# Patient Record
Sex: Male | Born: 1985 | Hispanic: No | Marital: Single | Smoking: Current every day smoker
Health system: Southern US, Community
[De-identification: ages and names within clinical notes are randomized; demographics above are authoritative.]

## PROBLEM LIST (undated history)

## (undated) DIAGNOSIS — F1910 Other psychoactive substance abuse, uncomplicated: Secondary | ICD-10-CM

---
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
## 2014-07-11 ENCOUNTER — Emergency Department (HOSPITAL_COMMUNITY)
Admission: EM | Admit: 2014-07-11 | Discharge: 2014-07-11 | Disposition: A | Discharge status: Home / Self Care | Payer: Self-pay | Attending: Emergency Medicine | Admitting: Emergency Medicine

## 2014-07-11 ENCOUNTER — Encounter (HOSPITAL_COMMUNITY): Payer: Self-pay | Admitting: *Deleted

## 2014-07-11 ENCOUNTER — Emergency Department (HOSPITAL_COMMUNITY): Payer: Self-pay

## 2014-07-11 DIAGNOSIS — S99912A Unspecified injury of left ankle, initial encounter: Secondary | ICD-10-CM | POA: Insufficient documentation

## 2014-07-11 DIAGNOSIS — Y9339 Activity, other involving climbing, rappelling and jumping off: Secondary | ICD-10-CM | POA: Insufficient documentation

## 2014-07-11 DIAGNOSIS — Y9289 Other specified places as the place of occurrence of the external cause: Secondary | ICD-10-CM | POA: Insufficient documentation

## 2014-07-11 DIAGNOSIS — L03116 Cellulitis of left lower limb: Secondary | ICD-10-CM | POA: Insufficient documentation

## 2014-07-11 DIAGNOSIS — Y9389 Activity, other specified: Secondary | ICD-10-CM | POA: Insufficient documentation

## 2014-07-11 DIAGNOSIS — Z720 Tobacco use: Secondary | ICD-10-CM | POA: Insufficient documentation

## 2014-07-11 DIAGNOSIS — X58XXXA Exposure to other specified factors, initial encounter: Secondary | ICD-10-CM | POA: Insufficient documentation

## 2014-07-11 DIAGNOSIS — Y998 Other external cause status: Secondary | ICD-10-CM | POA: Insufficient documentation

## 2014-07-11 LAB — CBC WITH DIFFERENTIAL/PLATELET
BASOS PCT: 0 % (ref 0–1)
Basophils Absolute: 0 10*3/uL (ref 0.0–0.1)
EOS ABS: 0.1 10*3/uL (ref 0.0–0.7)
EOS PCT: 1 % (ref 0–5)
HCT: 36.1 % — ABNORMAL LOW (ref 39.0–52.0)
Hemoglobin: 12.2 g/dL — ABNORMAL LOW (ref 13.0–17.0)
Lymphocytes Relative: 14 % (ref 12–46)
Lymphs Abs: 1.1 10*3/uL (ref 0.7–4.0)
MCH: 27 pg (ref 26.0–34.0)
MCHC: 33.8 g/dL (ref 30.0–36.0)
MCV: 79.9 fL (ref 78.0–100.0)
MONO ABS: 0.8 10*3/uL (ref 0.1–1.0)
MONOS PCT: 9 % (ref 3–12)
Neutro Abs: 6 10*3/uL (ref 1.7–7.7)
Neutrophils Relative %: 76 % (ref 43–77)
Platelets: 152 10*3/uL (ref 150–400)
RBC: 4.52 MIL/uL (ref 4.22–5.81)
RDW: 13.2 % (ref 11.5–15.5)
WBC: 8 10*3/uL (ref 4.0–10.5)

## 2014-07-11 LAB — BASIC METABOLIC PANEL
Anion gap: 9 (ref 5–15)
BUN: 8 mg/dL (ref 6–20)
CALCIUM: 8.8 mg/dL — AB (ref 8.9–10.3)
CO2: 26 mmol/L (ref 22–32)
CREATININE: 1.32 mg/dL — AB (ref 0.61–1.24)
Chloride: 98 mmol/L — ABNORMAL LOW (ref 101–111)
GFR calc Af Amer: >60 mL/min (ref >60)
Glucose, Bld: 103 mg/dL — ABNORMAL HIGH (ref 65–99)
Potassium: 3.8 mmol/L (ref 3.5–5.1)
Sodium: 133 mmol/L — ABNORMAL LOW (ref 135–145)

## 2014-07-11 MED ORDER — CLINDAMYCIN HCL 300 MG PO CAPS: 300.0000 mg | ORAL_CAPSULE | Freq: Four times a day (QID) | ORAL | Status: DC

## 2014-07-11 MED ORDER — CLINDAMYCIN PHOSPHATE 600 MG/50ML IV SOLN
600.0000 mg | Freq: Once | INTRAVENOUS | Status: AC
  Administered 2014-07-11: 600 mg via INTRAVENOUS
  Filled 2014-07-11: qty 50

## 2014-07-11 NOTE — ED Provider Notes (Signed)
CSN: [id number]     Arrival date & time 07/11/14  0238 History   This chart was scribed for Charles Rhineonald Maryiah Olvey, MD by Abel PrestoKara Demonbreun, ED Scribe. This patient was seen in room A02C/A02C and the patient's care was started at 3:51 AM.    Chief Complaint  Patient presents with  . Ankle Injury   Patient gave verbal permission to utilize photo for medical documentation only The image was not stored on any personal device   Patient is a 29 y.o. male presenting with lower extremity injury. The history is provided by the patient. No language interpreter was used.  Ankle Injury This is a new problem. The current episode started more than 2 days ago. The problem occurs constantly. The problem has not changed (increased redness to area) since onset.Pertinent negatives include no chest pain, no abdominal pain and no headaches.   HPI Comments: Charles Ortega is a 29 y.o. male who presents to the Emergency Department complaining of left ankle injury with onset yesterday. Pt states he jumped off a balcony 3 days ago. Noted redness to area. 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 He states redness began after jumping from balcony. Pt reports recent cough. Pt is a smoker. Pt sleeping in exam room. Pt denies back pain, neck pain, head injury, and LOC.   PMH - none  Soc hx -IVDA heroin History  Substance Use Topics  . Smoking status: Current Every Day Smoker  . Smokeless tobacco: Not on file  . Alcohol Use: No    Review of Systems  Cardiovascular: Negative for chest pain.  Gastrointestinal: Negative for abdominal pain.  Musculoskeletal: Positive for myalgias and arthralgias. Negative for back pain and neck pain.  Skin: Positive for color change (redness).  Neurological: Negative for syncope and headaches.  All other systems reviewed and are negative.     Allergies  Review of patient's allergies indicates no known allergies.  Home Medications   Prior to Admission medications   Not  on File   BP 116/63 mmHg  Pulse 98  Temp(Src) 98.6 F (37 C) (Oral)  Resp 12  SpO2 94% Physical Exam  Nursing note and vitals reviewed.  CONSTITUTIONAL: disheveled, sleeping HEAD: Normocephalic/atraumatic EYES: EOMI/PERRL ENMT: Mucous membranes moist NECK: supple no meningeal signs SPINE/BACK:entire spine nontender CV: S1/S2 noted, no murmurs/rubs/gallops noted LUNGS: scattered wheezing, no apparent distress ABDOMEN: soft, nontender, no rebound or guarding, bowel sounds noted throughout abdomen GU:no cva tenderness NEURO: Pt is somnolent but easily arousible moves all extremitiesx4.  No facial droop.   EXTREMITIES: pulses normal/equal, full ROM; no crepitus to left foot or ankle; see photo Pt can flex/extend left ankle SKIN: warm, color normal PSYCH: no abnormalities of mood noted, alert and oriented to situation      ED Course  Procedures  DIAGNOSTIC STUDIES: Oxygen Saturation is 94% on room air, normal by my interpretation.    COORDINATION OF CARE: 3:57 AM Discussed treatment plan with patient at beside, the patient agrees with the plan and has no further questions at this time.    Pt ambulatory No signs of acute fx Suspect this all due to cellulitis from IVDA He range the joint - low suspicion for septic joint No signs of deep space infection Advised repeat exam in 48hrs BP 122/66 mmHg  Pulse 89  Temp(Src) 99.6 F (37.6 C) (Oral)  Resp 16  SpO2 98%  Labs Review Labs Reviewed  CBC WITH DIFFERENTIAL/PLATELET - Abnormal; Notable for the following:  Hemoglobin 12.2 (*)    HCT 36.1 (*)    All other components within normal limits  BASIC METABOLIC PANEL - Abnormal; Notable for the following:    Sodium 133 (*)    Chloride 98 (*)    Glucose, Bld 103 (*)    Creatinine, Ser 1.32 (*)    Calcium 8.8 (*)    All other components within normal limits    Imaging Review Dg Ankle Complete Left  07/11/2014   CLINICAL DATA:  Jumped off balcony, with left foot and  ankle pain. Left foot and ankle erythema and swelling. Initial encounter.  EXAM: LEFT ANKLE COMPLETE - 3+ VIEW  COMPARISON:  None.  FINDINGS: There is no evidence of fracture or dislocation. The ankle mortise is intact; the interosseous space is within normal limits. No talar tilt or subluxation is seen.  The joint spaces are preserved. Mild medial soft tissue swelling is noted. Apparent edema is noted within Kager's fat pad.  IMPRESSION: No evidence of fracture or dislocation.   Electronically Signed   By: Roanna Raider M.D.   On: 07/11/2014 04:09   Dg Foot Complete Left  07/11/2014   CLINICAL DATA:  Jumped off balcony, with left foot and ankle pain. Erythema and swelling at the left foot and ankle. Initial encounter.  EXAM: LEFT FOOT - COMPLETE 3+ VIEW  COMPARISON:  None.  FINDINGS: There is no evidence of fracture or dislocation. The joint spaces are preserved. There is no evidence of talar subluxation; the subtalar joint is unremarkable in appearance. A small os peroneum is noted.  No significant soft tissue abnormalities are seen.  IMPRESSION: 1. No evidence of fracture or dislocation. 2. Small os peroneum noted.   Electronically Signed   By: Roanna Raider M.D.   On: 07/11/2014 04:10     EKG Interpretation None      MDM   Final diagnoses:  Cellulitis of left ankle    Nursing notes including past medical history and social history reviewed and considered in documentation xrays/imaging reviewed by myself and considered during evaluation Labs/vital reviewed myself and considered during evaluation  I personally performed the services described in this documentation, which was scribed in my presence. The recorded information has been reviewed and is accurate.        Charles Rhine, MD 07/11/14 [phone]

## 2014-07-11 NOTE — ED Notes (Signed)
The pt is c/o lt foot and ankle pain from  Jumping off a balcony after using heroin and cocaine.  He has been shooting heroin in his feet and arms  Redness and swelling lt foot and ankle

## 2014-07-11 NOTE — ED Notes (Signed)
Ambulated patient in hallway. Patient complained of moderate pain in the left ankle and limped in the hallway. We were met by Dr. Christy Gentles, who gave him the game plan for his care in the coming 10 days.

## 2014-07-11 NOTE — ED Notes (Signed)
MD at bedside. 

## 2014-10-03 ENCOUNTER — Emergency Department (HOSPITAL_COMMUNITY): Payer: MEDICAID

## 2014-10-03 ENCOUNTER — Encounter (HOSPITAL_COMMUNITY): Payer: Self-pay

## 2014-10-03 ENCOUNTER — Emergency Department (HOSPITAL_COMMUNITY): Payer: Self-pay

## 2014-10-03 ENCOUNTER — Inpatient Hospital Stay (HOSPITAL_COMMUNITY)
Admission: EM | Admit: 2014-10-03 | Discharge: 2014-10-08 | DRG: 557 | Disposition: A | Discharge status: Home / Self Care | Payer: Self-pay | Attending: Internal Medicine | Admitting: Internal Medicine

## 2014-10-03 DIAGNOSIS — M7981 Nontraumatic hematoma of soft tissue: Secondary | ICD-10-CM | POA: Diagnosis present

## 2014-10-03 DIAGNOSIS — M6282 Rhabdomyolysis: Principal | ICD-10-CM | POA: Diagnosis present

## 2014-10-03 DIAGNOSIS — E861 Hypovolemia: Secondary | ICD-10-CM | POA: Diagnosis present

## 2014-10-03 DIAGNOSIS — G9340 Encephalopathy, unspecified: Secondary | ICD-10-CM | POA: Diagnosis present

## 2014-10-03 DIAGNOSIS — R531 Weakness: Secondary | ICD-10-CM

## 2014-10-03 DIAGNOSIS — D696 Thrombocytopenia, unspecified: Secondary | ICD-10-CM | POA: Diagnosis present

## 2014-10-03 DIAGNOSIS — N19 Unspecified kidney failure: Secondary | ICD-10-CM

## 2014-10-03 DIAGNOSIS — R6510 Systemic inflammatory response syndrome (SIRS) of non-infectious origin without acute organ dysfunction: Secondary | ICD-10-CM | POA: Diagnosis present

## 2014-10-03 DIAGNOSIS — E876 Hypokalemia: Secondary | ICD-10-CM | POA: Diagnosis present

## 2014-10-03 DIAGNOSIS — G587 Mononeuritis multiplex: Secondary | ICD-10-CM | POA: Diagnosis present

## 2014-10-03 DIAGNOSIS — R7401 Elevation of levels of liver transaminase levels: Secondary | ICD-10-CM | POA: Diagnosis present

## 2014-10-03 DIAGNOSIS — E871 Hypo-osmolality and hyponatremia: Secondary | ICD-10-CM | POA: Diagnosis present

## 2014-10-03 DIAGNOSIS — G589 Mononeuropathy, unspecified: Secondary | ICD-10-CM | POA: Diagnosis present

## 2014-10-03 DIAGNOSIS — F1910 Other psychoactive substance abuse, uncomplicated: Secondary | ICD-10-CM | POA: Diagnosis present

## 2014-10-03 DIAGNOSIS — K7290 Hepatic failure, unspecified without coma: Secondary | ICD-10-CM

## 2014-10-03 DIAGNOSIS — IMO0002 Reserved for concepts with insufficient information to code with codable children: Secondary | ICD-10-CM

## 2014-10-03 DIAGNOSIS — N179 Acute kidney failure, unspecified: Secondary | ICD-10-CM | POA: Diagnosis present

## 2014-10-03 DIAGNOSIS — F17210 Nicotine dependence, cigarettes, uncomplicated: Secondary | ICD-10-CM | POA: Diagnosis present

## 2014-10-03 DIAGNOSIS — T401X1A Poisoning by heroin, accidental (unintentional), initial encounter: Secondary | ICD-10-CM | POA: Diagnosis present

## 2014-10-03 DIAGNOSIS — K7200 Acute and subacute hepatic failure without coma: Secondary | ICD-10-CM | POA: Diagnosis present

## 2014-10-03 DIAGNOSIS — D72829 Elevated white blood cell count, unspecified: Secondary | ICD-10-CM | POA: Diagnosis present

## 2014-10-03 DIAGNOSIS — R740 Nonspecific elevation of levels of transaminase and lactic acid dehydrogenase [LDH]: Secondary | ICD-10-CM

## 2014-10-03 LAB — BLOOD GAS, ARTERIAL
Acid-base deficit: 6.2 mmol/L — ABNORMAL HIGH (ref 0.0–2.0)
Bicarbonate: 18.5 mEq/L — ABNORMAL LOW (ref 20.0–24.0)
Drawn by: 11249
FIO2: 0.21
O2 SAT: 95.2 %
PATIENT TEMPERATURE: 97.7
TCO2: 16.4 mmol/L (ref 0–100)
pCO2 arterial: 35 mmHg (ref 35.0–45.0)
pH, Arterial: 7.339 — ABNORMAL LOW (ref 7.350–7.450)
pO2, Arterial: 84.8 mmHg (ref 80.0–100.0)

## 2014-10-03 LAB — CBC WITH DIFFERENTIAL/PLATELET
BASOS ABS: 0 10*3/uL (ref 0.0–0.1)
BASOS PCT: 0 % (ref 0–1)
Eosinophils Absolute: 0 10*3/uL (ref 0.0–0.7)
Eosinophils Relative: 0 % (ref 0–5)
HEMATOCRIT: 48.6 % (ref 39.0–52.0)
HEMOGLOBIN: 16.6 g/dL (ref 13.0–17.0)
LYMPHS PCT: 5 % — AB (ref 12–46)
Lymphs Abs: 1.1 10*3/uL (ref 0.7–4.0)
MCH: 28.6 pg (ref 26.0–34.0)
MCHC: 34.2 g/dL (ref 30.0–36.0)
MCV: 83.6 fL (ref 78.0–100.0)
Monocytes Absolute: 1.2 10*3/uL — ABNORMAL HIGH (ref 0.1–1.0)
Monocytes Relative: 5 % (ref 3–12)
NEUTROS ABS: 20.1 10*3/uL — AB (ref 1.7–7.7)
NEUTROS PCT: 90 % — AB (ref 43–77)
Platelets: 179 10*3/uL (ref 150–400)
RBC: 5.81 MIL/uL (ref 4.22–5.81)
RDW: 14.1 % (ref 11.5–15.5)
WBC: 22.4 10*3/uL — ABNORMAL HIGH (ref 4.0–10.5)

## 2014-10-03 LAB — COMPREHENSIVE METABOLIC PANEL
ALBUMIN: 4.5 g/dL (ref 3.5–5.0)
ALK PHOS: 107 U/L (ref 38–126)
ALT: 2312 U/L — AB (ref 17–63)
AST: 2696 U/L — AB (ref 15–41)
Anion gap: 14 (ref 5–15)
BILIRUBIN TOTAL: 0.9 mg/dL (ref 0.3–1.2)
BUN: 41 mg/dL — AB (ref 6–20)
CALCIUM: 7.9 mg/dL — AB (ref 8.9–10.3)
CO2: 20 mmol/L — ABNORMAL LOW (ref 22–32)
CREATININE: 3 mg/dL — AB (ref 0.61–1.24)
Chloride: 98 mmol/L — ABNORMAL LOW (ref 101–111)
GFR calc Af Amer: 31 mL/min — ABNORMAL LOW (ref >60)
GFR calc non Af Amer: 27 mL/min — ABNORMAL LOW (ref >60)
GLUCOSE: 110 mg/dL — AB (ref 65–99)
POTASSIUM: 5.1 mmol/L (ref 3.5–5.1)
Sodium: 132 mmol/L — ABNORMAL LOW (ref 135–145)
TOTAL PROTEIN: 7.3 g/dL (ref 6.5–8.1)

## 2014-10-03 LAB — URINALYSIS, ROUTINE W REFLEX MICROSCOPIC
Bilirubin Urine: NEGATIVE
GLUCOSE, UA: NEGATIVE mg/dL
Ketones, ur: NEGATIVE mg/dL
Nitrite: NEGATIVE
Protein, ur: 100 mg/dL — AB
SPECIFIC GRAVITY, URINE: 1.013 (ref 1.005–1.030)
UROBILINOGEN UA: 0.2 mg/dL (ref 0.0–1.0)
pH: 5.5 (ref 5.0–8.0)

## 2014-10-03 LAB — RAPID URINE DRUG SCREEN, HOSP PERFORMED
Amphetamines: NOT DETECTED
Barbiturates: NOT DETECTED
Benzodiazepines: POSITIVE — AB
COCAINE: POSITIVE — AB
Opiates: POSITIVE — AB
Tetrahydrocannabinol: NOT DETECTED

## 2014-10-03 LAB — TSH: TSH: 1.574 u[IU]/mL (ref 0.350–4.500)

## 2014-10-03 LAB — URINE MICROSCOPIC-ADD ON

## 2014-10-03 LAB — I-STAT CG4 LACTIC ACID, ED
Lactic Acid, Venous: 3.53 mmol/L (ref 0.5–2.0)
Lactic Acid, Venous: 1.33 mmol/L (ref 0.5–2.0)

## 2014-10-03 LAB — CK

## 2014-10-03 MED ORDER — LORAZEPAM 2 MG/ML IJ SOLN
1.0000 mg | Freq: Once | INTRAMUSCULAR | Status: AC
  Administered 2014-10-03: 1 mg via INTRAVENOUS
  Filled 2014-10-03: qty 1

## 2014-10-03 MED ORDER — GADOBENATE DIMEGLUMINE 529 MG/ML IV SOLN
15.0000 mL | Freq: Once | INTRAVENOUS | Status: AC | PRN
  Administered 2014-10-03: 15 mL via INTRAVENOUS

## 2014-10-03 MED ORDER — HYDROMORPHONE HCL 1 MG/ML IJ SOLN: 1.0000 mg | Freq: Once | INTRAMUSCULAR | Status: DC

## 2014-10-03 MED ORDER — SODIUM CHLORIDE 0.9 % IV SOLN
Freq: Once | INTRAVENOUS | Status: AC
  Administered 2014-10-03: 19:00:00 via INTRAVENOUS

## 2014-10-03 MED ORDER — SODIUM BICARBONATE 8.4 % IV SOLN
50.0000 meq | Freq: Once | INTRAVENOUS | Status: AC
  Administered 2014-10-04: 50 meq via INTRAVENOUS
  Filled 2014-10-03: qty 50
  Filled 2014-10-03: qty 100

## 2014-10-03 MED ORDER — LORAZEPAM 2 MG/ML IJ SOLN
0.5000 mg | Freq: Four times a day (QID) | INTRAMUSCULAR | Status: DC | PRN
  Administered 2014-10-04: 1 mg via INTRAVENOUS
  Filled 2014-10-03: qty 1

## 2014-10-03 MED ORDER — SODIUM BICARBONATE 8.4 % IV SOLN
INTRAVENOUS | Status: DC
  Administered 2014-10-04 – 2014-10-05 (×4): via INTRAVENOUS
  Filled 2014-10-03 (×9): qty 100

## 2014-10-03 MED ORDER — METHOCARBAMOL 500 MG PO TABS
500.0000 mg | ORAL_TABLET | Freq: Once | ORAL | Status: AC
  Administered 2014-10-03: 500 mg via ORAL
  Filled 2014-10-03: qty 1

## 2014-10-03 MED ORDER — SODIUM CHLORIDE 0.9 % IV BOLUS (SEPSIS)
1000.0000 mL | Freq: Once | INTRAVENOUS | Status: AC
  Administered 2014-10-03: 1000 mL via INTRAVENOUS

## 2014-10-03 MED ORDER — HYDROMORPHONE HCL 1 MG/ML IJ SOLN
0.5000 mg | Freq: Once | INTRAMUSCULAR | Status: AC
  Administered 2014-10-03: 0.5 mg via INTRAVENOUS
  Filled 2014-10-03: qty 1

## 2014-10-03 NOTE — ED Notes (Signed)
Pt in MRI will obtain labs when pt returns.

## 2014-10-03 NOTE — Consult Note (Addendum)
Consult Reason for Consult:bilateral LE weakness Referring Physician: Dr Gypsy Balsam ED  CC: lower extremity weakness  HPI: Charles Ortega is an 29 y.o. male hx of polysubstance abuse, including daily marijuana and frequent cocaine and heroin use. Presents today unable to walk after waking up in the park. He is unsure how long he was in the park. Reports using heroin sometime yesterday and then does not recall anything until he awoke today. States he had to use his arms to pull himself under a covered area due to the rain. Notes some pain in his bilateral quadriceps and hips. Denies any UE weakness. No difficulty or changes with bowel/bladder.   Labs pertinent for CK > 50,000, lactic acid 3.53, BUN 41, Cr 3.00, ASA 2696, ALT 2312, WBC 22. UDS positive for opiates, cocaine and benzodiazepines.   No past medical history on file.  No past surgical history on file.  No family history on file.  Social History:  reports that he has been smoking.  He does not have any smokeless tobacco history on file. He reports that he drinks about 0.6 oz of alcohol per week. He reports that he uses illicit drugs (Cocaine).  No Known Allergies  Medications: I have reviewed the patient's current medications.   ROS: Out of a complete 14 system review, the patient complains of only the following symptoms, and all other reviewed systems are negative. +weakness  Physical Examination: Filed Vitals:   10/03/14 1845  BP: 111/77  Pulse: 118  Temp:   Resp: 18   Physical Exam  Constitutional: He appears well-developed and well-nourished.  Psych: Affect appropriate to situation Eyes: No scleral injection HENT: No OP obstrucion Head: Normocephalic.  Cardiovascular: Normal rate and regular rhythm.  Respiratory: Effort normal and breath sounds normal.  GI: Soft. Bowel sounds are normal. No distension. There is no tenderness.  Skin: multiple track marks throughout  Neurologic Examination Mental  Status: Alert, oriented, thought content appropriate.  Speech fluent without evidence of aphasia.  Able to follow 3 step commands without difficulty. Cranial Nerves: II: funduscopic exam wnl bilaterally, visual fields grossly normal, pupils equal, round, reactive to light and accommodation III,IV, VI: ptosis not present, extra-ocular motions intact bilaterally V,VII: smile symmetric, facial light touch sensation normal bilaterally VIII: hearing normal bilaterally IX,X: gag reflex present XI: trapezius strength/neck flexion strength normal bilaterally XII: tongue strength normal  Motor: Right : Upper extremity    Left:     Upper extremity 5/5 deltoid       5/5 deltoid 5/5 biceps      5/5 biceps  5/5 triceps      5/5 triceps 5/5 hand grip      5/5 hand grip  Lower extremity     Lower extremity 5/5 hip flexor      5/5 hip flexor 5/5 hip adductors     5/5 hip adductors 5/5 hip abductors     5/5 hip abductors 2/5 quadricep      2/5 quadriceps  5/5 hamstrings     5/5 hamstrings 5/5 plantar flexion       5/5 plantar flexion 5/5 plantar extension     5/5 plantar extension Tone and bulk:normal tone throughout; no atrophy noted Sensory: Pinprick and light touch decreased bilateral LE in L4/5 distribution Deep Tendon Reflexes: 2+ biceps, triceps brachioradialis bilaterally. Trace patellar bilaterally. 2+ achilles bilaterally Plantars: Right: downgoing   Left: downgoing Cerebellar: normal finger-to-nose, unable to test HTS Gait: deferred  Laboratory Studies:   Basic Metabolic Panel:  No results for input(s): NA, K, CL, CO2, GLUCOSE, BUN, CREATININE, CALCIUM, MG, PHOS in the last 168 hours.  Liver Function Tests: No results for input(s): AST, ALT, ALKPHOS, BILITOT, PROT, ALBUMIN in the last 168 hours. No results for input(s): LIPASE, AMYLASE in the last 168 hours. No results for input(s): AMMONIA in the last 168 hours.  CBC:  Recent Labs Lab 10/03/14 1843  WBC 22.4*  NEUTROABS 20.1*   HGB 16.6  HCT 48.6  MCV 83.6  PLT 179    Cardiac Enzymes: No results for input(s): CKTOTAL, CKMB, CKMBINDEX, TROPONINI in the last 168 hours.  BNP: Invalid input(s): POCBNP  CBG: No results for input(s): GLUCAP in the last 168 hours.  Microbiology: No results found for this or any previous visit.  Coagulation Studies: No results for input(s): LABPROT, INR in the last 72 hours.  Urinalysis:  Recent Labs Lab 10/03/14 1820  COLORURINE RED*  LABSPEC 1.013  PHURINE 5.5  GLUCOSEU NEGATIVE  HGBUR LARGE*  BILIRUBINUR NEGATIVE  KETONESUR NEGATIVE  PROTEINUR 100*  UROBILINOGEN 0.2  NITRITE NEGATIVE  LEUKOCYTESUR TRACE*    Lipid Panel:  No results found for: CHOL, TRIG, HDL, CHOLHDL, VLDL, LDLCALC  HgbA1C: No results found for: HGBA1C  Urine Drug Screen:     Component Value Date/Time   LABOPIA POSITIVE* 10/03/2014 1820   COCAINSCRNUR POSITIVE* 10/03/2014 1820   LABBENZ POSITIVE* 10/03/2014 1820   AMPHETMU NONE DETECTED 10/03/2014 1820   THCU NONE DETECTED 10/03/2014 1820   LABBARB NONE DETECTED 10/03/2014 1820    Alcohol Level: No results for input(s): ETH in the last 168 hours.  Other results:  Imaging: No results found.   Assessment/Plan: 28y/o male with history of multiple drug abuse presenting with acute onset of proximal lower extremity weakness localized to his bilateral quadriceps. Exam also notable for decreased LT and PP below the knees bilaterally. Labs pertinent for markedly elevated CK > 50,000, elevated AST and ALT, elevated lactic acid, BUN and Cr. UDS + for opiates, cocaine and benzo. MRI imaging reviewed, shows no acute process.    Both cocaine and heroin have been known to cause rhabdomyolysis. Prolonged stasis on a hard surface could also be contributing. No noted history of prolonged exertion. Will check for alternative etiologies.   -HIV, Hepatitis panel, ANA, ANCA, TSH -IV hydration and close monitoring of renal function -rehab  evaluation -will continue to follow  Elspeth Cho, DO [facility] [phone]  If 7pm- 7am, please page neurology on call as listed in AMION. 10/03/2014, 7:32 PM

## 2014-10-03 NOTE — ED Notes (Signed)
Patient transported to MRI 

## 2014-10-03 NOTE — ED Notes (Signed)
As per reported by Morrie Sheldon RN  Pt is in MRI, his cap refill is sluggish in lower extremities and bilateral hip and leg pain

## 2014-10-03 NOTE — ED Notes (Signed)
Dr Hyacinth Meeker made aware of abnormal Lactic.

## 2014-10-03 NOTE — ED Notes (Signed)
Pt returned from MRI , doctor at bedside doing neuro assessment,,  Pt states he was found in park and last he remembers he had been there since 2 pm yesterday.  Pt is alert and oriented,  Continues to be unable to lift and hold lower legs as reported at shift change

## 2014-10-03 NOTE — ED Notes (Signed)
Pt is still in MRI.

## 2014-10-03 NOTE — H&P (Signed)
[facility] Admission History and Physical       Charles Ortega ZOX:[id number] DOB: 1985-09-07 DOA: 10/03/2014  Referring physician: EDP PCP: No primary care provider on file.  Specialists:   Chief Complaint: Weakness  HPI: Charles Ortega is a 29 y.o. male who presented to the ED after [place] the [place] after [place] up on Heroin and using cocaine 24 hours earlier.  He reports havign increased pain all over and weakness  In both legs.    He was evaluated in the ED and found to have a CPK of > 50, 000.    He was also found to have elevated LFTs and an increased BUN/Cr.   He was unable to move his legs so an MRI of the T-Spine and Lspine were performed, and  Neurology was consulted and he was evaluated by Dr. Elspeth Cho.   His symptoms began to improve after hydration was initiated.      Review of Systems:  Constitutional: No Weight Loss, No Weight Gain, Night Sweats, Fevers, Chills, Dizziness, Light Headedness, Fatigue, or Generalized Weakness HEENT: No Headaches, Difficulty Swallowing,Tooth/Dental Problems,Sore Throat,  No Sneezing, Rhinitis, Ear Ache, Nasal Congestion, or Post Nasal Drip,  Cardio-vascular:  No Chest pain, Orthopnea, PND, Edema in Lower Extremities, Anasarca, Dizziness, Palpitations  Resp: No Dyspnea, No DOE, No Productive Cough, No Non-Productive Cough, No Hemoptysis, No Wheezing.    GI: No Heartburn, Indigestion, Abdominal Pain, Nausea, Vomiting, Diarrhea, Constipation, Hematemesis, Hematochezia, Melena, Change in Bowel Habits,  Loss of Appetite  GU: No Dysuria, No Change in Color of Urine, No Urgency or Urinary Frequency, No Flank pain.  Musculoskeletal:  +Generalized Myalgias, +Bilateral Hip and thigh Pain with Decreased Range of Motion, No Back Pain.  Neurologic: No Syncope, No Seizures, Muscle Weakness, Paresthesia, Vision Disturbance or Loss, No Diplopia, No Vertigo, No Difficulty Walking,  Skin: No Rash or Lesions. Psych: No Change in Mood  or Affect, No Depression or Anxiety, No Memory loss, No Confusion, or Hallucinations   No past medical history on file.   No past surgical history on file.    Prior to Admission medications   Medication Sig Start Date End Date Taking? Authorizing Provider  clindamycin (CLEOCIN) 300 MG capsule Take 1 capsule (300 mg total) by mouth 4 (four) times daily. X 7 days Patient not taking: Reported on 10/03/2014 07/11/14   Zadie Rhine, MD     No Known Allergies    Social History:  reports that he has been smoking.  He does not have any smokeless tobacco history on file. He reports that he drinks about 0.6 oz of alcohol per week. He reports that he uses illicit drugs (Cocaine).     No family history on file.     Physical Exam:  GEN:  Pleasant Thin Disheveled 29 y.o. Caucasian male examined and in no acute distress; cooperative with exam Filed Vitals:   10/03/14 1726 10/03/14 1729 10/03/14 1845  BP:  91/70 111/77  Pulse:  98 118  Temp:  97.7 F (36.5 C)   TempSrc:  Oral   Resp:  17 18  Height: 5\' 11"  (1.803 m)    Weight: 77.111 kg (170 lb)    SpO2:  98% 93%   Blood pressure 111/77, pulse 118, temperature 97.7 F (36.5 C), temperature source Oral, resp. rate 18, height 5\' 11"  (1.803 m), weight 77.111 kg (170 lb), SpO2 93 %. PSYCH: He is alert and oriented x4; does not appear anxious does not appear depressed; affect  is normal HEENT: Normocephalic and Atraumatic, Mucous membranes pink; PERRLA; EOM intact; Fundi:  Benign;  No scleral icterus, Nares: Patent, Oropharynx: Clear, Fair Dentition,    Neck:  FROM, No Cervical Lymphadenopathy nor Thyromegaly or Carotid Bruit; No JVD; Breasts:: Not examined CHEST WALL: No tenderness CHEST: Normal respiration, clear to auscultation bilaterally HEART: Regular rate and rhythm; no murmurs rubs or gallops BACK: No kyphosis or scoliosis; No CVA tenderness ABDOMEN: Positive Bowel Sounds, Soft Non-Tender, No Rebound or Guarding; No Masses, No  Organomegaly. Rectal Exam: Not done EXTREMITIES: No Cyanosis, Clubbing, or Edema; Numerous Ulcerations of arms and legs ( Track Marks) Genitalia: not examined PULSES: 2+ and symmetric SKIN: Normal hydration no rash or ulceration CNS:  Alert and Oriented x 4, No Focal Deficits, Generalized Weakness and Proximal Weakness in Both  Quadriceps Vascular: pulses palpable throughout    Labs on Admission:  Basic Metabolic Panel:  Recent Labs Lab 10/03/14 1843  NA 132*  K 5.1  CL 98*  CO2 20*  GLUCOSE 110*  BUN 41*  CREATININE 3.00*  CALCIUM 7.9*   Liver Function Tests:  Recent Labs Lab 10/03/14 1843  AST 2696*  ALT 2312*  ALKPHOS 107  BILITOT 0.9  PROT 7.3  ALBUMIN 4.5   No results for input(s): LIPASE, AMYLASE in the last 168 hours. No results for input(s): AMMONIA in the last 168 hours. CBC:  Recent Labs Lab 10/03/14 1843  WBC 22.4*  NEUTROABS 20.1*  HGB 16.6  HCT 48.6  MCV 83.6  PLT 179   Cardiac Enzymes:  Recent Labs Lab 10/03/14 1843  CKTOTAL >50000*    BNP (last 3 results) No results for input(s): BNP in the last 8760 hours.  ProBNP (last 3 results) No results for input(s): PROBNP in the last 8760 hours.  CBG: No results for input(s): GLUCAP in the last 168 hours.  Radiological Exams on Admission: Dg Chest 2 View  10/03/2014   CLINICAL DATA:  Abnormal x-ray. Airspace opacities suggested on MRI thoracic spine. Patient reports no chest pain or complaints. Current smoker.  EXAM: CHEST  2 VIEW  COMPARISON:  06/24/2006  FINDINGS: The heart size and mediastinal contours are within normal limits. Both lungs are clear. The visualized skeletal structures are unremarkable.  IMPRESSION: No active cardiopulmonary disease. MRI findings may have been related to motion artifact.   Electronically Signed   By: Burman Nieves M.D.   On: 10/03/2014 22:44   Mr Thoracic Spine W Wo Contrast  10/03/2014   CLINICAL DATA:  Lower extremity numbness, hip and back pain.  Difficulty walking, weakness. Found in park, one-day of amnesia.  EXAM: MRI THORACIC AND LUMBAR SPINE WITHOUT AND WITH CONTRAST  TECHNIQUE: Multiplanar and multiecho pulse sequences of the thoracic and lumbar spine were obtained without and with intravenous contrast.  CONTRAST:  15mL MULTIHANCE GADOBENATE DIMEGLUMINE 529 MG/ML IV SOLN  COMPARISON:  None.  FINDINGS: MR THORACIC SPINE FINDINGS  Moderately motion degraded examination.  The thoracic alignment is normal. There are no marrow space abnormalities and there is no fracture or mass lesion. No abnormal osseous or intradiscal enhancement. Patchy airspace opacities RIGHT lung.  The cord has normal signal and morphology. No abnormal cord, leptomeningeal nor epidural enhancement. The disks appear normal and there is no disc protrusion, spondylosis, or spinal stenosis.  MR LUMBAR SPINE FINDINGS  Moderately motion degraded examination. Lumbar vertebral bodies and posterior elements are intact and aligned with maintenance of the lumbar lordosis. Intervertebral disc morphology is maintained. Decreased T2 signal within the  L4-5 disc compatible with mild desiccation. Minimal chronic discogenic endplate changes L4-5. No STIR signal abnormality to suggest acute osseous process. No abnormal osseous or intradiscal enhancement.  Conus medullaris terminates at T12-L1 and appears normal in morphology and signal characteristics. No abnormal cord, leptomeningeal or epidural enhancement. Limited by patient positioning and motion though, the there appears to be T2 bright signal within the RIGHT iliopsoas muscles at the level of the pelvis.  Level by level evaluation:  L1-2, L2-3, L3-4: No significant disc bulge, canal stenosis or neural foraminal narrowing.  L4-5: Small central disc protrusion, enhancing annular fissure. Minimal facet arthropathy and ligamentum flavum redundancy without canal stenosis or definite neural foraminal narrowing.  L5-S1: Tiny broad-based LEFT central  disc protrusion without canal stenosis or neural foraminal narrowing.  IMPRESSION: MRI THORACIC SPINE: Motion degraded examination with generally normal MRI of the thoracic spine with and without contrast.  Included view of the chest demonstrates patchy airspace opacities RIGHT lung for which dedicated chest radiograph is recommended.  MRI LUMBAR SPINE: Small L4-5 and L5-S1 disc protrusions, L4-5 annular fissure. No neurocompressive changes.  No acute fracture, malalignment or suspicious enhancement.  Suspected edema/strain of the RIGHT iliopsoas muscles though, this could alternatively represent motion artifact.   Electronically Signed   By: Awilda Metro M.D.   On: 10/03/2014 21:56   Mr Lumbar Spine W Wo Contrast  10/03/2014   CLINICAL DATA:  Lower extremity numbness, hip and back pain. Difficulty walking, weakness. Found in park, one-day of amnesia.  EXAM: MRI THORACIC AND LUMBAR SPINE WITHOUT AND WITH CONTRAST  TECHNIQUE: Multiplanar and multiecho pulse sequences of the thoracic and lumbar spine were obtained without and with intravenous contrast.  CONTRAST:  15mL MULTIHANCE GADOBENATE DIMEGLUMINE 529 MG/ML IV SOLN  COMPARISON:  None.  FINDINGS: MR THORACIC SPINE FINDINGS  Moderately motion degraded examination.  The thoracic alignment is normal. There are no marrow space abnormalities and there is no fracture or mass lesion. No abnormal osseous or intradiscal enhancement. Patchy airspace opacities RIGHT lung.  The cord has normal signal and morphology. No abnormal cord, leptomeningeal nor epidural enhancement. The disks appear normal and there is no disc protrusion, spondylosis, or spinal stenosis.  MR LUMBAR SPINE FINDINGS  Moderately motion degraded examination. Lumbar vertebral bodies and posterior elements are intact and aligned with maintenance of the lumbar lordosis. Intervertebral disc morphology is maintained. Decreased T2 signal within the L4-5 disc compatible with mild desiccation. Minimal  chronic discogenic endplate changes L4-5. No STIR signal abnormality to suggest acute osseous process. No abnormal osseous or intradiscal enhancement.  Conus medullaris terminates at T12-L1 and appears normal in morphology and signal characteristics. No abnormal cord, leptomeningeal or epidural enhancement. Limited by patient positioning and motion though, the there appears to be T2 bright signal within the RIGHT iliopsoas muscles at the level of the pelvis.  Level by level evaluation:  L1-2, L2-3, L3-4: No significant disc bulge, canal stenosis or neural foraminal narrowing.  L4-5: Small central disc protrusion, enhancing annular fissure. Minimal facet arthropathy and ligamentum flavum redundancy without canal stenosis or definite neural foraminal narrowing.  L5-S1: Tiny broad-based LEFT central disc protrusion without canal stenosis or neural foraminal narrowing.  IMPRESSION: MRI THORACIC SPINE: Motion degraded examination with generally normal MRI of the thoracic spine with and without contrast.  Included view of the chest demonstrates patchy airspace opacities RIGHT lung for which dedicated chest radiograph is recommended.  MRI LUMBAR SPINE: Small L4-5 and L5-S1 disc protrusions, L4-5 annular fissure. No neurocompressive changes.  No acute fracture, malalignment or suspicious enhancement.  Suspected edema/strain of the RIGHT iliopsoas muscles though, this could alternatively represent motion artifact.   Electronically Signed   By: Awilda Metro M.D.   On: 10/03/2014 21:56     Assessment/Plan:      29 y.o. male with  Principal Problem:   1.    Rhabdomyolysis   IVFs   Monitor CPKs   Active Problems:   2.    AKI (acute kidney injury)   Monitor BUN/Cr   IVFs     3.    Elevated transaminase level   Checking Hepatitis Panel, HIV, and ANCA, ANA Levels     Monitor Trend     4.    Polysubstance abuse   Social Work Librarian, academic for Substance Abuse Help     5.    Leukocytosis   Monitor Trend      6.    DVT Prophylaxis   SCDs     Code Status:     FULL CODE   Family Communication:    No Family Present    Disposition Plan:    Inpatient Status        Time spent:  83 Minutes      Ron Parker [facility] Pager [phone]   If 7AM -7PM Please Contact the Day Rounding Team MD for [facility]  If 7PM-7AM, Please Contact Night-Floor Coverage  [email or web address] Password [facility] 10/03/2014, 10:47 PM     ADDENDUM:   Patient was seen and examined on 10/03/2014

## 2014-10-03 NOTE — ED Notes (Signed)
Pt is in MRI, so they will be a delay in labs and saline

## 2014-10-03 NOTE — ED Notes (Signed)
Per EMS- c/o numbness in legs bilaterally. Denies pain. Full sensation in both legs. VS: BP 140/76 HR 78 RR 18.

## 2014-10-03 NOTE — ED Notes (Signed)
MRI called [id number]  Pt in severe pain cannot hold still for MRI imaging, this writer spoke with Dr Hyacinth Meeker, he gave verbal order for 0.5 mg Dilaudid IV

## 2014-10-03 NOTE — ED Provider Notes (Signed)
CSN: [id number]     Arrival date & time 10/03/14  1715 History   First MD Initiated Contact with Patient 10/03/14 1725     Chief Complaint  Patient presents with  . Numbness    legs     (Consider location/radiation/quality/duration/timing/severity/associated sxs/prior Treatment) HPI  The patient is a 29 year old male, he has a history of multiple drug abuse including daily marijuana, frequent cocaine and heroin. The patient states that he woke up in the park, he is not sure how long he has been there but he was unable to walk when he awoke and states he had to pull himself by his arms under a covered area b/c of the rain - he thinks that the last time that he couuld walk was yesterday, when he used Heroin and fell asleep.  He denies all other sx including fevers, chills, back pain, neck pain, weakness or numbness of the arms, there is no abdominal pain chest pain or shortness of breath.    No past medical history on file. No past surgical history on file. No family history on file. Social History  Substance Use Topics  . Smoking status: Current Every Day Smoker  . Smokeless tobacco: Not on file  . Alcohol Use: 0.6 oz/week    1 Cans of beer per week    Review of Systems  All other systems reviewed and are negative.     Allergies  Review of patient's allergies indicates no known allergies.  Home Medications   Prior to Admission medications   Medication Sig Start Date End Date Taking? Authorizing Provider  clindamycin (CLEOCIN) 300 MG capsule Take 1 capsule (300 mg total) by mouth 4 (four) times daily. X 7 days Patient not taking: Reported on 10/03/2014 07/11/14   Zadie Rhine, MD   BP 111/77 mmHg  Pulse 118  Temp(Src) 97.7 F (36.5 C) (Oral)  Resp 18  Ht 5\' 11"  (1.803 m)  Wt 170 lb (77.111 kg)  BMI 23.72 kg/m2  SpO2 93% Physical Exam  Constitutional: He appears well-developed and well-nourished. No distress.  HENT:  Head: Normocephalic and atraumatic.   Mouth/Throat: Oropharynx is clear and moist. No oropharyngeal exudate.  Eyes: Conjunctivae and EOM are normal. Pupils are equal, round, and reactive to light. Right eye exhibits no discharge. Left eye exhibits no discharge. No scleral icterus.  Neck: Normal range of motion. Neck supple. No JVD present. No thyromegaly present.  Cardiovascular: Normal rate, regular rhythm, normal heart sounds and intact distal pulses.  Exam reveals no gallop and no friction rub.   No murmur heard. Pulmonary/Chest: Effort normal and breath sounds normal. No respiratory distress. He has no wheezes. He has no rales.  Abdominal: Soft. Bowel sounds are normal. He exhibits no distension and no mass. There is no tenderness.  Musculoskeletal: Normal range of motion. He exhibits no edema or tenderness.  Lymphadenopathy:    He has no cervical adenopathy.  Neurological: He is alert.  This first the patient has weakness of his quadriceps but he is able to have normal strength at his hamstrings, normal hip flexors, decreased sensation bilaterally below the knees, decreased but present reflexes at the knees.  Skin: Skin is warm and dry. No rash noted. No erythema.  Psychiatric: He has a normal mood and affect. His behavior is normal.  Nursing note and vitals reviewed.   ED Course  Procedures (including critical care time) Labs Review Labs Reviewed  CBC WITH DIFFERENTIAL/PLATELET - Abnormal; Notable for the following:    WBC  22.4 (*)    Neutrophils Relative % 90 (*)    Neutro Abs 20.1 (*)    Lymphocytes Relative 5 (*)    Monocytes Absolute 1.2 (*)    All other components within normal limits  COMPREHENSIVE METABOLIC PANEL - Abnormal; Notable for the following:    Sodium 132 (*)    Chloride 98 (*)    CO2 20 (*)    Glucose, Bld 110 (*)    BUN 41 (*)    Creatinine, Ser 3.00 (*)    Calcium 7.9 (*)    AST 2696 (*)    ALT 2312 (*)    GFR calc non Af Amer 27 (*)    GFR calc Af Amer 31 (*)    All other components  within normal limits  URINE RAPID DRUG SCREEN, HOSP PERFORMED - Abnormal; Notable for the following:    Opiates POSITIVE (*)    Cocaine POSITIVE (*)    Benzodiazepines POSITIVE (*)    All other components within normal limits  URINALYSIS, ROUTINE W REFLEX MICROSCOPIC (NOT AT [facility]) - Abnormal; Notable for the following:    Color, Urine RED (*)    APPearance CLOUDY (*)    Hgb urine dipstick LARGE (*)    Protein, ur 100 (*)    Leukocytes, UA TRACE (*)    All other components within normal limits  CK - Abnormal; Notable for the following:    Total CK >50000 (*)    All other components within normal limits  I-STAT CG4 LACTIC ACID, ED - Abnormal; Notable for the following:    Lactic Acid, Venous 3.53 (*)    All other components within normal limits  URINE MICROSCOPIC-ADD ON  HIV ANTIBODY (ROUTINE TESTING)  HEPATITIS PANEL, ACUTE  ANTINUCLEAR ANTIBODIES, IFA  ANCA TITERS  TSH  BLOOD GAS, ARTERIAL  HEPATIC FUNCTION PANEL  I-STAT CG4 LACTIC ACID, ED    Imaging Review Dg Chest 2 View  10/03/2014   CLINICAL DATA:  Abnormal x-ray. Airspace opacities suggested on MRI thoracic spine. Patient reports no chest pain or complaints. Current smoker.  EXAM: CHEST  2 VIEW  COMPARISON:  06/24/2006  FINDINGS: The heart size and mediastinal contours are within normal limits. Both lungs are clear. The visualized skeletal structures are unremarkable.  IMPRESSION: No active cardiopulmonary disease. MRI findings may have been related to motion artifact.   Electronically Signed   By: Burman Nieves M.D.   On: 10/03/2014 22:44   Mr Thoracic Spine W Wo Contrast  10/03/2014   CLINICAL DATA:  Lower extremity numbness, hip and back pain. Difficulty walking, weakness. Found in park, one-day of amnesia.  EXAM: MRI THORACIC AND LUMBAR SPINE WITHOUT AND WITH CONTRAST  TECHNIQUE: Multiplanar and multiecho pulse sequences of the thoracic and lumbar spine were obtained without and with intravenous contrast.  CONTRAST:   15mL MULTIHANCE GADOBENATE DIMEGLUMINE 529 MG/ML IV SOLN  COMPARISON:  None.  FINDINGS: MR THORACIC SPINE FINDINGS  Moderately motion degraded examination.  The thoracic alignment is normal. There are no marrow space abnormalities and there is no fracture or mass lesion. No abnormal osseous or intradiscal enhancement. Patchy airspace opacities RIGHT lung.  The cord has normal signal and morphology. No abnormal cord, leptomeningeal nor epidural enhancement. The disks appear normal and there is no disc protrusion, spondylosis, or spinal stenosis.  MR LUMBAR SPINE FINDINGS  Moderately motion degraded examination. Lumbar vertebral bodies and posterior elements are intact and aligned with maintenance of the lumbar lordosis. Intervertebral disc morphology is maintained.  Decreased T2 signal within the L4-5 disc compatible with mild desiccation. Minimal chronic discogenic endplate changes L4-5. No STIR signal abnormality to suggest acute osseous process. No abnormal osseous or intradiscal enhancement.  Conus medullaris terminates at T12-L1 and appears normal in morphology and signal characteristics. No abnormal cord, leptomeningeal or epidural enhancement. Limited by patient positioning and motion though, the there appears to be T2 bright signal within the RIGHT iliopsoas muscles at the level of the pelvis.  Level by level evaluation:  L1-2, L2-3, L3-4: No significant disc bulge, canal stenosis or neural foraminal narrowing.  L4-5: Small central disc protrusion, enhancing annular fissure. Minimal facet arthropathy and ligamentum flavum redundancy without canal stenosis or definite neural foraminal narrowing.  L5-S1: Tiny broad-based LEFT central disc protrusion without canal stenosis or neural foraminal narrowing.  IMPRESSION: MRI THORACIC SPINE: Motion degraded examination with generally normal MRI of the thoracic spine with and without contrast.  Included view of the chest demonstrates patchy airspace opacities RIGHT lung  for which dedicated chest radiograph is recommended.  MRI LUMBAR SPINE: Small L4-5 and L5-S1 disc protrusions, L4-5 annular fissure. No neurocompressive changes.  No acute fracture, malalignment or suspicious enhancement.  Suspected edema/strain of the RIGHT iliopsoas muscles though, this could alternatively represent motion artifact.   Electronically Signed   By: Awilda Metro M.D.   On: 10/03/2014 21:56   Mr Lumbar Spine W Wo Contrast  10/03/2014   CLINICAL DATA:  Lower extremity numbness, hip and back pain. Difficulty walking, weakness. Found in park, one-day of amnesia.  EXAM: MRI THORACIC AND LUMBAR SPINE WITHOUT AND WITH CONTRAST  TECHNIQUE: Multiplanar and multiecho pulse sequences of the thoracic and lumbar spine were obtained without and with intravenous contrast.  CONTRAST:  15mL MULTIHANCE GADOBENATE DIMEGLUMINE 529 MG/ML IV SOLN  COMPARISON:  None.  FINDINGS: MR THORACIC SPINE FINDINGS  Moderately motion degraded examination.  The thoracic alignment is normal. There are no marrow space abnormalities and there is no fracture or mass lesion. No abnormal osseous or intradiscal enhancement. Patchy airspace opacities RIGHT lung.  The cord has normal signal and morphology. No abnormal cord, leptomeningeal nor epidural enhancement. The disks appear normal and there is no disc protrusion, spondylosis, or spinal stenosis.  MR LUMBAR SPINE FINDINGS  Moderately motion degraded examination. Lumbar vertebral bodies and posterior elements are intact and aligned with maintenance of the lumbar lordosis. Intervertebral disc morphology is maintained. Decreased T2 signal within the L4-5 disc compatible with mild desiccation. Minimal chronic discogenic endplate changes L4-5. No STIR signal abnormality to suggest acute osseous process. No abnormal osseous or intradiscal enhancement.  Conus medullaris terminates at T12-L1 and appears normal in morphology and signal characteristics. No abnormal cord, leptomeningeal or  epidural enhancement. Limited by patient positioning and motion though, the there appears to be T2 bright signal within the RIGHT iliopsoas muscles at the level of the pelvis.  Level by level evaluation:  L1-2, L2-3, L3-4: No significant disc bulge, canal stenosis or neural foraminal narrowing.  L4-5: Small central disc protrusion, enhancing annular fissure. Minimal facet arthropathy and ligamentum flavum redundancy without canal stenosis or definite neural foraminal narrowing.  L5-S1: Tiny broad-based LEFT central disc protrusion without canal stenosis or neural foraminal narrowing.  IMPRESSION: MRI THORACIC SPINE: Motion degraded examination with generally normal MRI of the thoracic spine with and without contrast.  Included view of the chest demonstrates patchy airspace opacities RIGHT lung for which dedicated chest radiograph is recommended.  MRI LUMBAR SPINE: Small L4-5 and L5-S1 disc protrusions, L4-5 annular  fissure. No neurocompressive changes.  No acute fracture, malalignment or suspicious enhancement.  Suspected edema/strain of the RIGHT iliopsoas muscles though, this could alternatively represent motion artifact.   Electronically Signed   By: Awilda Metro M.D.   On: 10/03/2014 21:56   I have personally reviewed and evaluated these images and lab results as part of my medical decision-making.    MDM   Final diagnoses:  Weakness  Non-traumatic rhabdomyolysis  MOSF (multiple organ systems failure)  Renal failure  Liver failure    discussed with neurology immediately, Dr. Amada Jupiter recommends stat MRI of the thoracic and lumbar spines.  The patient has multiple abnormalities on his lab work including severe rhabdomyolysis with a creatine kinase is greater than 50,000, acute renal failure with a creatinine of 3, acute transaminitis and hepatitis with liver function tests close to 3000. He does appear to have ongoing neurologic dysfunction, his MRIs do not show a discrete source of this,  neurology has seen him at the bedside, he needs to be admitted, he [facility] be [facility] to [facility]. His vital signs remained in a relatively normal range.  Resuscitative efforts were started with normal saline times multiple boluses.  Meds given in ED:  Medications  0.9 %  sodium chloride infusion ( Intravenous Stopped 10/03/14 2121)  LORazepam (ATIVAN) injection 1 mg (1 mg Intravenous Given 10/03/14 1908)  methocarbamol (ROBAXIN) tablet 500 mg (500 mg Oral Given 10/03/14 1908)  HYDROmorphone (DILAUDID) injection 0.5 mg (0.5 mg Intravenous Given 10/03/14 2013)  sodium chloride 0.9 % bolus 1,000 mL (1,000 mLs Intravenous New Bag/Given 10/03/14 2135)  gadobenate dimeglumine (MULTIHANCE) injection 15 mL (15 mLs Intravenous Contrast Given 10/03/14 2016)  sodium chloride 0.9 % bolus 1,000 mL (1,000 mLs Intravenous New Bag/Given 10/03/14 2135)      Eber Hong, MD 10/03/14 2304

## 2014-10-04 DIAGNOSIS — T796XXA Traumatic ischemia of muscle, initial encounter: Secondary | ICD-10-CM

## 2014-10-04 LAB — BASIC METABOLIC PANEL
ANION GAP: 10 (ref 5–15)
BUN: 45 mg/dL — ABNORMAL HIGH (ref 6–20)
CHLORIDE: 103 mmol/L (ref 101–111)
CO2: 22 mmol/L (ref 22–32)
CREATININE: 3.33 mg/dL — AB (ref 0.61–1.24)
Calcium: 7.6 mg/dL — ABNORMAL LOW (ref 8.9–10.3)
GFR calc non Af Amer: 24 mL/min — ABNORMAL LOW (ref >60)
GFR, EST AFRICAN AMERICAN: 28 mL/min — AB (ref >60)
Glucose, Bld: 118 mg/dL — ABNORMAL HIGH (ref 65–99)
POTASSIUM: 3.7 mmol/L (ref 3.5–5.1)
SODIUM: 135 mmol/L (ref 135–145)

## 2014-10-04 LAB — BLOOD GAS, ARTERIAL
ACID-BASE DEFICIT: 2.7 mmol/L — AB (ref 0.0–2.0)
Bicarbonate: 21.7 mEq/L (ref 20.0–24.0)
DRAWN BY: 31101
FIO2: 0.21
O2 SAT: 96.9 %
PCO2 ART: 37.2 mmHg (ref 35.0–45.0)
Patient temperature: 97.8
TCO2: 22.9 mmol/L (ref 0–100)
pH, Arterial: 7.381 (ref 7.350–7.450)
pO2, Arterial: 95 mmHg (ref 80.0–100.0)

## 2014-10-04 LAB — CBC
HCT: 40.7 % (ref 39.0–52.0)
Hemoglobin: 14.1 g/dL (ref 13.0–17.0)
MCH: 28.4 pg (ref 26.0–34.0)
MCHC: 34.6 g/dL (ref 30.0–36.0)
MCV: 82.1 fL (ref 78.0–100.0)
PLATELETS: 122 10*3/uL — AB (ref 150–400)
RBC: 4.96 MIL/uL (ref 4.22–5.81)
RDW: 14.2 % (ref 11.5–15.5)
WBC: 12 10*3/uL — AB (ref 4.0–10.5)

## 2014-10-04 LAB — HIV ANTIBODY (ROUTINE TESTING W REFLEX): HIV Screen 4th Generation wRfx: NONREACTIVE

## 2014-10-04 LAB — MRSA PCR SCREENING: MRSA by PCR: NEGATIVE

## 2014-10-04 LAB — PROCALCITONIN: PROCALCITONIN: 71.57 ng/mL

## 2014-10-04 LAB — CK

## 2014-10-04 MED ORDER — ACETAMINOPHEN 325 MG PO TABS: 650.0000 mg | ORAL_TABLET | Freq: Four times a day (QID) | ORAL | Status: DC | PRN

## 2014-10-04 MED ORDER — ONDANSETRON HCL 4 MG/2ML IJ SOLN: 4.0000 mg | Freq: Four times a day (QID) | INTRAMUSCULAR | Status: DC | PRN

## 2014-10-04 MED ORDER — SODIUM CHLORIDE 0.9 % IJ SOLN
3.0000 mL | Freq: Two times a day (BID) | INTRAMUSCULAR | Status: DC
  Administered 2014-10-04 – 2014-10-07 (×5): 3 mL via INTRAVENOUS

## 2014-10-04 MED ORDER — ONDANSETRON HCL 4 MG PO TABS
4.0000 mg | ORAL_TABLET | Freq: Four times a day (QID) | ORAL | Status: DC | PRN
Start: 2014-10-04 — End: 2014-10-08

## 2014-10-04 MED ORDER — ALUM & MAG HYDROXIDE-SIMETH 200-200-20 MG/5ML PO SUSP: 30.0000 mL | Freq: Four times a day (QID) | ORAL | Status: DC | PRN

## 2014-10-04 MED ORDER — SULFAMETHOXAZOLE-TRIMETHOPRIM 800-160 MG PO TABS
1.0000 | ORAL_TABLET | Freq: Two times a day (BID) | ORAL | Status: DC
  Administered 2014-10-04 – 2014-10-05 (×3): 1 via ORAL
  Filled 2014-10-04 (×4): qty 1

## 2014-10-04 MED ORDER — ACETAMINOPHEN 650 MG RE SUPP: 650.0000 mg | Freq: Four times a day (QID) | RECTAL | Status: DC | PRN

## 2014-10-04 MED ORDER — OXYCODONE HCL 5 MG PO TABS: 5.0000 mg | ORAL_TABLET | ORAL | Status: DC | PRN

## 2014-10-04 MED ORDER — METHOCARBAMOL 500 MG PO TABS
500.0000 mg | ORAL_TABLET | Freq: Four times a day (QID) | ORAL | Status: DC | PRN
  Administered 2014-10-04 – 2014-10-08 (×6): 500 mg via ORAL
  Filled 2014-10-04 (×6): qty 1

## 2014-10-04 MED ORDER — HYDROMORPHONE HCL 1 MG/ML IJ SOLN
0.5000 mg | INTRAMUSCULAR | Status: DC | PRN
  Administered 2014-10-04 – 2014-10-05 (×9): 1 mg via INTRAVENOUS
  Filled 2014-10-04 (×9): qty 1

## 2014-10-04 MED ORDER — PIPERACILLIN-TAZOBACTAM 3.375 G IVPB
3.3750 g | Freq: Three times a day (TID) | INTRAVENOUS | Status: DC
  Administered 2014-10-04 – 2014-10-05 (×4): 3.375 g via INTRAVENOUS
  Filled 2014-10-04 (×6): qty 50

## 2014-10-04 NOTE — Progress Notes (Signed)
Patient ID: Charles Ortega, male   DOB: 05/17/1985, 29 y.o.   MRN: [id number] [facility] PROGRESS NOTE  Charles Ortega TDV:[id number] DOB: November 14, 1985 DOA: 10/03/2014 PCP: No primary care provider on file.   Brief narrative:    29 y.o. male presented to the ED with altered mental status, per report has been using heroin and cocaine for the past 24 hours. In ED, CK > 50 K, unable to mover LE's, neurology consulted and [facility] asked to admit for further evaluation.   Assessment/Plan:    Principal Problem:   Acute encephalopathy - likely from OD on heroin and cocaine - still somnolent on exam this AM - keep in SDU for now - supportive care with IVF, ABX as noted below - monitor blood work and vital signs   ? Sepsis - pt met criteria for sepsis with HR up to 118, RR up to 30, WBC 22K, lactic acid > 3 - UA suggestive of possible UTI, no evidence of PNA on CXR - given IVDA pt is at high risk for staph, MRSA infections - will place on broad spectrum ABX for now Zosyn, Bactrim (avoid vanc due to renal failure) - check procalcitonin level, blood and urine cultures  repeat lactic acid level to ensure it is stabilizing    Rhabdomyolysis - from prolonged immobility and drug use - CK still > 50,000 - increase the rate of IVF to 150 cc/hr for now - monitor CK level daily  Active Problems:   AKI (acute kidney injury) - secondary to rhabdo, pre renal etiology, high risk for progression to ATN - continue IVF, avoid nephrotoxins - repeat BMP in AM   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 rhabdo - monitor  - abd [place] requested    Polysubstance abuse - heroin, cocaine - discuss cessation once pt more medically stable   Thrombocytopenia - possibly  related to acute illness - no signs of active bleeding - CBC in AM  DVT prophylaxis - SCD's  Code Status: Full.  Family Communication:  No family at bedside  Disposition Plan: Keep in SDU, not ready for discharge at this time due to critical nature of the illness   IV access:  Peripheral IV  Procedures and diagnostic studies:    Dg Chest 2 View 10/03/2014  No active cardiopulmonary disease. MRI findings may have been related to motion artifact.    Mr Thoracic and Lumbar Spine W Wo Contrast 10/03/2014  MRI THORACIC SPINE: Motion degraded examination with generally normal MRI of the thoracic spine with and without contrast.  Included view of the chest demonstrates patchy airspace opacities RIGHT lung for which dedicated chest radiograph is recommended.  MRI LUMBAR SPINE: Small L4-5 and L5-S1 disc protrusions, L4-5 annular fissure. No neurocompressive changes.  No acute fracture, malalignment or suspicious enhancement.  Suspected edema/strain of the RIGHT iliopsoas muscles though, this could alternatively represent motion artifact.   Medical Consultants:  Neurology   Other Consultants:  None  IAnti-Infectives:   Zosyn 8/28 --> Bactrim 8/28 -->  Charles Ramsay, MD  [facility] Pager [phone]  If 7PM-7AM, please contact night-coverage [email or web address] Password [facility] 10/04/2014, 11:16 AM   LOS: 1 day   HPI/Subjective: No events overnight.   Objective: Filed Vitals:   10/04/14 0230 10/04/14 0300  10/04/14 0400 10/04/14 0800  BP: 117/83 127/80 129/78 126/84  Pulse: 100 89 94 94  Temp:   97.4 F (36.3 C) 97.8 F (36.6 C)  TempSrc:   Oral Oral  Resp: $Remo'14 17 14 14  'XpxEr$ Height:      Weight:      SpO2: 100% 94% 96% 98%    Intake/Output Summary (Last 24 hours) at 10/04/14 1116 Last data filed at 10/04/14 0600  Gross per 24 hour  Intake 745.83 ml  Output   1750 ml  Net -1004.17 ml    Exam:   General:  Pt is somnolent but can be aroused, NAD  Cardiovascular: Regular rate and  rhythm, S1/S2, no murmurs, no rubs, no gallops  Respiratory: Clear to auscultation bilaterally, no wheezing, diminished breath sounds at bases   Abdomen: Soft, non tender, non distended, bowel sounds present, no guarding  Extremities: pulses DP and PT palpable bilaterally  Neuro: somnolent but can be awaken, withdraws to nail bed pressing   Data Reviewed: Basic Metabolic Panel:  Recent Labs Lab 10/03/14 1843 10/04/14 0620  NA 132* 135  K 5.1 3.7  CL 98* 103  CO2 20* 22  GLUCOSE 110* 118*  BUN 41* 45*  CREATININE 3.00* 3.33*  CALCIUM 7.9* 7.6*   Liver Function Tests:  Recent Labs Lab 10/03/14 1843  AST 2696*  ALT 2312*  ALKPHOS 107  BILITOT 0.9  PROT 7.3  ALBUMIN 4.5   No results for input(s): LIPASE, AMYLASE in the last 168 hours. No results for input(s): AMMONIA in the last 168 hours. CBC:  Recent Labs Lab 10/03/14 1843 10/04/14 0620  WBC 22.4* 12.0*  NEUTROABS 20.1*  --   HGB 16.6 14.1  HCT 48.6 40.7  MCV 83.6 82.1  PLT 179 122*   Cardiac Enzymes:  Recent Labs Lab 10/03/14 1843 10/04/14 0620  CKTOTAL >50000* >50000*   BNP: Invalid input(s): POCBNP CBG: No results for input(s): GLUCAP in the last 168 hours.  Recent Results (from the past 240 hour(s))  MRSA PCR Screening     Status: None   Collection Time: 10/04/14  1:58 AM  Result Value Ref Range Status   MRSA by PCR NEGATIVE NEGATIVE Final    Comment:        The GeneXpert MRSA Assay (FDA approved for NASAL specimens only), is one component of a comprehensive MRSA colonization surveillance program. It is not intended to diagnose MRSA infection nor to guide or monitor treatment for MRSA infections.      Scheduled Meds: . sodium chloride  3 mL Intravenous Q12H   Continuous Infusions: .  sodium bicarbonate  infusion 1000 mL 125 mL/hr at 10/04/14 0002

## 2014-10-04 NOTE — ED Notes (Signed)
Pt is very drowsy,  Doesn't keep monitor leads on, redirected many times,

## 2014-10-04 NOTE — Progress Notes (Signed)
Subjective: No changes  Exam: Filed Vitals:   10/04/14 0800  BP: 126/84  Pulse: 94  Temp: 97.8 F (36.6 C)  Resp: 14   Gen: In bed, NAD Resp: non-labored breathing, no acute distress Abd: soft, nt  Neuro: MS: awake, alert MV:HQION Motor: 5/5 in UE with the exception of interossei on the right and bilateral leg knee extension, 4/5 interossei, 0/5 knee extension Sensory:decreased on the medial aspect of bilateral legs.  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 The right arm shows some excoriations directly over the ulnar nerve at the elbow. All were present on awakening from sleep. I suspect that all of this is due to positioning during his prolonged unconscious state. If he were to develop further mononeuroapthies from this point forward, however, then I would further work this up as a mononeuroapthy multiplex.   Recommendations: 1) Physical therapy 2) Feel low yield at this time for further workup, but if further symptoms occur, would treat this as mononeuritis multiplex.   Ritta Slot, MD [facility] [phone]  If 7pm- 7am, please page neurology on call as listed in AMION.

## 2014-10-04 NOTE — Progress Notes (Signed)
ANTIBIOTIC CONSULT NOTE - INITIAL  Pharmacy Consult for zosyn Indication: rule out sepsis  No Known Allergies  Patient Measurements: Height:  (180.3 cm) Weight: 173 lb 11.6 oz (78.8 kg) IBW/kg (Calculated) : 75.3 Adjusted Body Weight:   Vital Signs: Temp: 97.8 F (36.6 C) (08/28 0800) Temp Source: Oral (08/28 0800) BP: 126/84 mmHg (08/28 0800) Pulse Rate: 94 (08/28 0800) Intake/Output from previous day: 08/27 0701 - 08/28 0700 In: 745.8 [I.V.:745.8] Out: 1750 [Urine:1750] Intake/Output from this shift: Total I/O In: -  Out: 850 [Urine:850]  Labs:  Recent Labs  10/03/14 1843 10/04/14 0620  WBC 22.4* 12.0*  HGB 16.6 14.1  PLT 179 122*  CREATININE 3.00* 3.33*   Estimated Creatinine Clearance: 35.8 mL/min (by C-G formula based on Cr of 3.33). No results for input(s): VANCOTROUGH, VANCOPEAK, VANCORANDOM, GENTTROUGH, GENTPEAK, GENTRANDOM, TOBRATROUGH, TOBRAPEAK, TOBRARND, AMIKACINPEAK, AMIKACINTROU, AMIKACIN in the last 72 hours.   Microbiology: Recent Results (from the past 720 hour(s))  MRSA PCR Screening     Status: None   Collection Time: 10/04/14  1:58 AM  Result Value Ref Range Status   MRSA by PCR NEGATIVE NEGATIVE Final    Comment:        The GeneXpert MRSA Assay (FDA approved for NASAL specimens only), is one component of a comprehensive MRSA colonization surveillance program. It is not intended to diagnose MRSA infection nor to guide or monitor treatment for MRSA infections.     Medical History: No past medical history on file.  Medications:  Anti-infectives    Start     Dose/Rate Route Frequency Ordered Stop   10/04/14 1300  piperacillin-tazobactam (ZOSYN) IVPB 3.375 g     3.375 g 12.5 mL/hr over 240 Minutes Intravenous Every 8 hours 10/04/14 1219     10/04/14 1230  sulfamethoxazole-trimethoprim (BACTRIM DS,SEPTRA DS) 800-160 MG per tablet 1 tablet     1 tablet Oral Every 12 hours 10/04/14 1149       Assessment: 28 yom presented to  the hospital with weakness. To start broad-spectrum antibiotics. MD started bactrim and pharmacy to start zosyn. Pt is afebrile and WBC is elevated at 12. Pt with AKI/rhabdo and Scr 3.33.   Zosyn 8/28>> Bactrim 8/28>>  Goal of Therapy:  Eradication of infection  Plan:  - Zosyn 3.375gm IV Q8H (4 hr inf) - F/u renal fxn, C&S, clinical status - De-escalate as able  Ronasia Isola, Drake Leach 10/04/2014,12:24 PM

## 2014-10-05 ENCOUNTER — Inpatient Hospital Stay (HOSPITAL_COMMUNITY): Payer: Self-pay

## 2014-10-05 DIAGNOSIS — T796XXD Traumatic ischemia of muscle, subsequent encounter: Secondary | ICD-10-CM

## 2014-10-05 LAB — COMPREHENSIVE METABOLIC PANEL
ALBUMIN: 3 g/dL — AB (ref 3.5–5.0)
ALT: 1282 U/L — AB (ref 17–63)
AST: 1048 U/L — AB (ref 15–41)
Alkaline Phosphatase: 80 U/L (ref 38–126)
Anion gap: 9 (ref 5–15)
BUN: 40 mg/dL — AB (ref 6–20)
CHLORIDE: 99 mmol/L — AB (ref 101–111)
CO2: 29 mmol/L (ref 22–32)
CREATININE: 3.45 mg/dL — AB (ref 0.61–1.24)
Calcium: 7.9 mg/dL — ABNORMAL LOW (ref 8.9–10.3)
GFR calc Af Amer: 27 mL/min — ABNORMAL LOW (ref >60)
GFR, EST NON AFRICAN AMERICAN: 23 mL/min — AB (ref >60)
GLUCOSE: 123 mg/dL — AB (ref 65–99)
Potassium: 3.4 mmol/L — ABNORMAL LOW (ref 3.5–5.1)
Sodium: 137 mmol/L (ref 135–145)
Total Bilirubin: 1.2 mg/dL (ref 0.3–1.2)
Total Protein: 5.2 g/dL — ABNORMAL LOW (ref 6.5–8.1)

## 2014-10-05 LAB — CBC
HEMATOCRIT: 40.5 % (ref 39.0–52.0)
Hemoglobin: 14.1 g/dL (ref 13.0–17.0)
MCH: 27.9 pg (ref 26.0–34.0)
MCHC: 34.8 g/dL (ref 30.0–36.0)
MCV: 80.2 fL (ref 78.0–100.0)
PLATELETS: 135 10*3/uL — AB (ref 150–400)
RBC: 5.05 MIL/uL (ref 4.22–5.81)
RDW: 13.9 % (ref 11.5–15.5)
WBC: 9.4 10*3/uL (ref 4.0–10.5)

## 2014-10-05 LAB — URINE CULTURE: Culture: NO GROWTH

## 2014-10-05 LAB — CK: Total CK: >50000 U/L — ABNORMAL HIGH (ref 49–397)

## 2014-10-05 LAB — PROCALCITONIN: PROCALCITONIN: 47.45 ng/mL

## 2014-10-05 LAB — HEPATITIS PANEL, ACUTE
HCV Ab: <0.1 s/co ratio (ref 0.0–0.9)
HEP B C IGM: NEGATIVE
Hep A IgM: NEGATIVE
Hepatitis B Surface Ag: NEGATIVE

## 2014-10-05 MED ORDER — OXYCODONE HCL 5 MG PO TABS
5.0000 mg | ORAL_TABLET | ORAL | Status: DC | PRN
  Administered 2014-10-06: 10 mg via ORAL
  Filled 2014-10-05: qty 2

## 2014-10-05 MED ORDER — HYDROMORPHONE HCL 1 MG/ML IJ SOLN
0.5000 mg | INTRAMUSCULAR | Status: DC | PRN
  Administered 2014-10-05 – 2014-10-06 (×2): 1 mg via INTRAVENOUS
  Filled 2014-10-05 (×3): qty 1

## 2014-10-05 MED ORDER — SODIUM CHLORIDE 0.9 % IV SOLN
INTRAVENOUS | Status: DC
  Administered 2014-10-05 – 2014-10-08 (×11): via INTRAVENOUS

## 2014-10-05 NOTE — Evaluation (Signed)
Physical Therapy Evaluation Patient Details Name: Charles Ortega MRN: [id number] DOB: 29-Sep-1985 Today's Date: 10/05/2014   History of Present Illness  Charles Ortega is a 29 y.o. male who [place] the ED [place] on Heroin and using cocaine 24 hours earlier, He reports having increased pain all over and weaknessin both legs  Clinical Impression  Pt admitted with above diagnosis. Pt currently with functional limitations due to the deficits listed below (see PT Problem List). Leg strength improved marginally as he functionally used his legs. Depending on LOS from a medical standpoint, feel he may be able to d/c straight to shelter (but he must be modified independent with equipment). Pt will benefit from skilled PT to increase their independence and safety with mobility to allow discharge to the venue listed below.       Follow Up Recommendations No PT follow up--but must be modified independent to d/c to shelter; if slow progress, may need inpt rehab    Equipment Recommendations  Rolling walker with 5" wheels    Recommendations for Other Services       Precautions / Restrictions Precautions Precautions: Fall Restrictions Weight Bearing Restrictions: No      Mobility  Bed Mobility Overal bed mobility: Modified Independent             General bed mobility comments: Use of rail and HOB up  Transfers Overall transfer level: Needs assistance Equipment used: Rolling walker (2 wheeled) Transfers: Sit to/from Stand Sit to Stand: Min assist;+2 safety/equipment;From elevated surface (recliner with pillow in seat) Stand pivot transfers: Min assist       General transfer comment: x2 (elevated bed, elevated recliner); assist to stabilize RW with one hand on surface and one on RW; pt achieves knee extension with hips flexed and then uses UEs on RW to fully stand (knees hyperextended)  Ambulation/Gait Ambulation/Gait assistance:  Min assist;+2 safety/equipment Ambulation Distance (Feet): 50 Feet Assistive device: Rolling walker (2 wheeled) Gait Pattern/deviations: Step-to pattern;Decreased stride length   Gait velocity interpretation: Below normal speed for age/gender General Gait Details: initially knees hyper extended with incr wt bearing on UEs; progressed to knees in neutral to slightly flexed without buckling and light pressure through hands on RW  Stairs            Wheelchair Mobility    Modified Rankin (Stroke Patients Only)       Balance Overall balance assessment: Needs assistance Sitting-balance support: No upper extremity supported;Feet supported Sitting balance-Leahy Scale: Good     Standing balance support: Bilateral upper extremity supported Standing balance-Leahy Scale: Poor                               Pertinent Vitals/Pain Pain Assessment: 0-10 Pain Score: 5  Pain Location: hips Pain Descriptors / Indicators: Aching;Sore Pain Intervention(s): Limited activity within patient's tolerance;Monitored during session;Repositioned    Home Living Family/patient expects to be discharged to:: Shelter/Homeless       Home Access: Ramped entrance (*shelter)         Additional Comments: Lives at [facility], handicapped height toilets with grab bars, walk in shower    Prior Function Level of Independence: Independent               Hand Dominance   Dominant Hand: Right    Extremity/Trunk Assessment   Upper Extremity Assessment: Overall WFL for tasks assessed  Lower Extremity Assessment: RLE deficits/detail;LLE deficits/detail RLE Deficits / Details: hip flexion 3+, hip ext 4/5, knee extension 2+, knee flexion 4/5; ankle DF 5/5 LLE Deficits / Details: hip flexion 3+, hip ext 4/5, knee extension 2+, knee flexion 4/5; ankle DF 5/5  Cervical / Trunk Assessment: Normal  Communication   Communication: No difficulties  Cognition  Arousal/Alertness: Awake/alert Behavior During Therapy: WFL for tasks assessed/performed Overall Cognitive Status: Within Functional Limits for tasks assessed                      General Comments      Exercises General Exercises - Lower Extremity Ankle Circles/Pumps: AROM;Both;10 reps Short Arc Quad: PROM;Both;5 reps;Supine Long Arc Quad: AROM;AAROM;Both;20 reps;Seated Heel Slides: AROM;Both      Assessment/Plan    PT Assessment Patient needs continued PT services  PT Diagnosis Difficulty walking   PT Problem List Decreased strength;Decreased balance;Decreased mobility;Decreased knowledge of use of DME;Pain  PT Treatment Interventions DME instruction;Gait training;Functional mobility training;Therapeutic activities;Therapeutic exercise;Balance training;Patient/family education   PT Goals (Current goals can be found in the Care Plan section) Acute Rehab PT Goals Patient Stated Goal: to be able to walk without the walker PT Goal Formulation: With patient Time For Goal Achievement: 10/12/14 Potential to Achieve Goals: Good    Frequency Min 5X/week   Barriers to discharge Decreased caregiver support needs to be modified ind for shelter    Co-evaluation               End of Session Equipment Utilized During Treatment: Gait belt Activity Tolerance: Patient tolerated treatment well Patient left: in chair;with call bell/phone within reach Nurse Communication: Mobility status (how to assist out of recliner)         Time: 4098-1191 PT Time Calculation (min) (ACUTE ONLY): 27 min   Charges:   PT Evaluation $Initial PT Evaluation Tier I: 1 Procedure PT Treatments $Gait Training: 8-22 mins   PT G Codes:        Charles Ortega 10/20/14, 4:38 PM Pager [phone]

## 2014-10-05 NOTE — Evaluation (Signed)
Occupational Therapy Evaluation Patient Details Name: Charles Ortega MRN: [id number] DOB: [birth year] Today's Date: 10/05/2014    History of Present Illness Charles Ortega is a 29 y.o. male who presented to the ED after [place] after [place] up on Heroin and using cocaine 24 hours earlier, He reports having increased pain all over and weaknessin both legs   Clinical Impression   This 29 yo male admitted with above presents to acute OT with decreased strength in Bil LEs thus affecting his ability and safety with BADLs. He will benefit from acute OT without need for follow up.    Follow Up Recommendations  No OT follow up    Equipment Recommendations  None recommended by OT       Precautions / Restrictions Precautions Precautions: Fall Restrictions Weight Bearing Restrictions: No      Mobility Bed Mobility Overal bed mobility: Modified Independent             General bed mobility comments: Use of rail and HOB up  Transfers Overall transfer level: Needs assistance Equipment used: Rolling walker (2 wheeled) Transfers: Sit to/from [employer] Sit to Stand: Mod assist Stand pivot transfers: Min assist            Balance Overall balance assessment: Needs assistance Sitting-balance support: No upper extremity supported;Feet supported Sitting balance-Leahy Scale: Good     Standing balance support: Bilateral upper extremity supported Standing balance-Leahy Scale: Poor                              ADL Overall ADL's : Needs assistance/impaired Eating/Feeding: Independent;Sitting   Grooming: Set up;Sitting   Upper Body Bathing: Set up;Sitting can cross his legs to get to his feet   Lower Body Bathing: Set up;Sitting/lateral leans   Upper Body Dressing : Set up;Sitting can cross his legs to get to his feet   Lower Body Dressing: Set up;Sitting/lateral leans   Toilet Transfer: Minimal  assistance;Stand-pivot;RW (bed>recliner next to bed)   Toileting- Clothing Manipulation and Hygiene: Minimal assistance (for balance sit<>stand)               Vision Additional Comments: No change in baseline          Pertinent Vitals/Pain Pain Assessment: 0-10 Pain Score: 5  Pain Location: hips Pain Descriptors / Indicators: Aching;Sore Pain Intervention(s): Monitored during session;Repositioned (had pt stand and stretch his legs--he reported it felt really good)     Hand Dominance Right   Extremity/Trunk Assessment Upper Extremity Assessment Upper Extremity Assessment: Overall WFL for tasks assessed   Lower Extremity Assessment Lower Extremity Assessment: Defer to PT evaluation       Communication Communication Communication: No difficulties   Cognition Arousal/Alertness: Awake/alert Behavior During Therapy: WFL for tasks assessed/performed Overall Cognitive Status: Within Functional Limits for tasks assessed                                Home Living Family/patient expects to be discharged to:: Shelter/Homeless                                 Additional Comments: Lives at [facility], handicapped height toilets with grab bars, walk in shower      Prior Functioning/Environment Level of Independence: Independent  OT Diagnosis: Generalized weakness;Acute pain   OT Problem List: Decreased strength;Impaired balance (sitting and/or standing);Pain;Decreased range of motion;Decreased knowledge of use of DME or AE   OT Treatment/Interventions: Self-care/ADL training    OT Goals(Current goals can be found in the care plan section) Acute Rehab OT Goals Patient Stated Goal: to be able to walk without the walker OT Goal Formulation: With patient Time For Goal Achievement: 10/12/14 Potential to Achieve Goals: Good  OT Frequency: Min 2X/week   Barriers to D/C: Decreased caregiver support             End of  Session Equipment Utilized During Treatment: Gait belt;Rolling walker Nurse Communication:  (nursing reports no need for chair alarm, just make sure he had his call bell)  Activity Tolerance: Patient tolerated treatment well Patient left: in chair;with call bell/phone within reach   Time: 1238-1300 OT Time Calculation (min): 22 min Charges:  OT General Charges $OT Visit: 1 Procedure OT Evaluation $Initial OT Evaluation Tier I: 1 Procedure Evette Georges [phone] 10/05/2014, 1:41 PM

## 2014-10-05 NOTE — Progress Notes (Signed)
[facility] TEAM 1 - Stepdown/ICU TEAM PROGRESS NOTE  Charles Ortega SNK:[id number] DOB: September 27, 1985 DOA: 10/03/2014 PCP: No primary care provider on file.  Admit HPI / Brief Narrative: 29 y.o. male w/ no signif hx who presented to the ED with altered mental status.  Per report had been using heroin and cocaine for 24 hours. In ED CK > 50 K, unable to move LE's.   HPI/Subjective: The patient has regained some use of his legs though it is somewhat clumsy at the present time.  His right upper extremity is also much stronger now.  He denies chest pain nausea vomiting or abdominal pain.  Assessment/Plan:  Acute encephalopathy - likely from OD on heroin and cocaine - Mental status continues to improve and appears to be close to baseline at this time  SIRS  - pt met criteria for SIRS with HR 118, RR 30, WBC 22K, lactic acid > 3 - No clear source of infection - given IVDA pt is at high risk for staph, MRSA infections - No evidence of infection therefore discontinue antibiotics and follow  Rhabdomyolysis - from prolonged immobility and drug use - CK > 50,000 for 3rd day in a row - continue high volume IVF in this o/w healthy young adult  - monitor CK level daily   Acute kidney injury - secondary to rhabdo - high risk for progression to permanent renal failure - discussed w/ pt  - continue IVF, avoid nephrotoxins  Mild hypokalemia - Likely due to high volume IV bicarbonate - changed to normal saline IV fluid - will not replace potassium in setting of severe acute renal failure unless <3.0  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) - pt apparently initially unable to move upper and lower extremities - slowly improving clinically - appreciate Neurology eval  Hyponatremia - Due to hypovolemia - corrected  Transaminitis  - likely shock liver - slowly improving - continue to hydrate  Polysubstance abuse - heroin, cocaine - discuss cessation once pt more medically stable -  social work consultation to assist  Thrombocytopenia - no signs of active bleeding - Stable  Code Status: FULL Family Communication: no family present at time of exam Disposition Plan: SDU  Consultants: Neurology   Procedures: none  Antibiotics: Zosyn 8/28 > 8/29 Septra 8/28 > 8/29  DVT prophylaxis: SCDs  Objective: Blood pressure 130/86, pulse 96, temperature 97.6 F (36.4 C), temperature source Oral, resp. rate 19, height _0  (1.803 m), weight 78.8 kg (173 lb 11.6 oz), SpO2 100 %.  Intake/Output Summary (Last 24 hours) at 10/05/14 1506 Last data filed at 10/05/14 1400  Gross per 24 hour  Intake   3475 ml  Output   3075 ml  Net    400 ml   Exam: General: No acute respiratory distress Lungs: Clear to auscultation bilaterally without wheezes or crackles Cardiovascular: Regular rate and rhythm without murmur gallop or rub normal S1 and S2 Abdomen: Nontender, nondistended, soft, bowel sounds positive, no rebound, no ascites, no appreciable mass Extremities: No significant cyanosis, clubbing, or edema bilateral lower extremities  Data Reviewed: Basic Metabolic Panel:  Recent Labs Lab 10/03/14 1843 10/04/14 0620 10/05/14 0239  NA 132* 135 137  K 5.1 3.7 3.4*  CL 98* 103 99*  CO2 20* 22 29  GLUCOSE 110* 118* 123*  BUN 41* 45* 40*  CREATININE 3.00* 3.33* 3.45*  CALCIUM 7.9* 7.6* 7.9*    CBC:  Recent Labs Lab 10/03/14 1843 10/04/14 0620 10/05/14 0239  WBC 22.4*  12.0* 9.4  NEUTROABS 20.1*  --   --   HGB 16.6 14.1 14.1  HCT 48.6 40.7 40.5  MCV 83.6 82.1 80.2  PLT 179 122* 135*    Liver Function Tests:  Recent Labs Lab 10/03/14 1843 10/05/14 0239  AST 2696* 1048*  ALT 2312* 1282*  ALKPHOS 107 80  BILITOT 0.9 1.2  PROT 7.3 5.2*  ALBUMIN 4.5 3.0*   Cardiac Enzymes:  Recent Labs Lab 10/03/14 1843 10/04/14 0620 10/05/14 0239  CKTOTAL >50000* >50000* >50000*    Recent Results (from the past 240 hour(s))  MRSA PCR Screening      Status: None   Collection Time: 10/04/14  1:58 AM  Result Value Ref Range Status   MRSA by PCR NEGATIVE NEGATIVE Final    Comment:        The GeneXpert MRSA Assay (FDA approved for NASAL specimens only), is one component of a comprehensive MRSA colonization surveillance program. It is not intended to diagnose MRSA infection nor to guide or monitor treatment for MRSA infections.   Culture, blood (routine x 2)     Status: None (Preliminary result)   Collection Time: 10/04/14  1:00 PM  Result Value Ref Range Status   Specimen Description BLOOD LEFT ANTECUBITAL  Final   Special Requests BOTTLES DRAWN AEROBIC AND ANAEROBIC 5CC  Final   Culture NO GROWTH < 24 HOURS  Final   Report Status PENDING  Incomplete  Culture, blood (routine x 2)     Status: None (Preliminary result)   Collection Time: 10/04/14  1:20 PM  Result Value Ref Range Status   Specimen Description BLOOD BLOOD RIGHT HAND  Final   Special Requests BOTTLES DRAWN AEROBIC ONLY 1CC  Final   Culture NO GROWTH < 24 HOURS  Final   Report Status PENDING  Incomplete  Urine culture     Status: None   Collection Time: 10/04/14  2:32 PM  Result Value Ref Range Status   Specimen Description URINE, RANDOM  Final   Special Requests NONE  Final   Culture NO GROWTH 1 DAY  Final   Report Status 10/05/2014 FINAL  Final     Studies:   Recent x-ray studies have been reviewed in detail by the Attending Physician  Scheduled Meds:  Scheduled Meds: . piperacillin-tazobactam (ZOSYN)  IV  3.375 g Intravenous Q8H  . sodium chloride  3 mL Intravenous Q12H  . sulfamethoxazole-trimethoprim  1 tablet Oral Q12H    Time spent on care of this patient: 35 mins   Charles Ortega , MD   [facility] Office  [phone] Pager - Text Page per Shea Evans as per below:  On-Call/Text Page:      [email or web address]      password [email or web address]  If 7PM-7AM, please contact night-coverage [email or web address] Password [email or web address] 10/05/2014, 3:06 PM   LOS: 2 days

## 2014-10-06 DIAGNOSIS — G9340 Encephalopathy, unspecified: Secondary | ICD-10-CM | POA: Diagnosis present

## 2014-10-06 DIAGNOSIS — E871 Hypo-osmolality and hyponatremia: Secondary | ICD-10-CM

## 2014-10-06 DIAGNOSIS — K7200 Acute and subacute hepatic failure without coma: Secondary | ICD-10-CM | POA: Diagnosis present

## 2014-10-06 DIAGNOSIS — K759 Inflammatory liver disease, unspecified: Secondary | ICD-10-CM

## 2014-10-06 DIAGNOSIS — G589 Mononeuropathy, unspecified: Secondary | ICD-10-CM

## 2014-10-06 DIAGNOSIS — R7401 Elevation of levels of liver transaminase levels: Secondary | ICD-10-CM | POA: Diagnosis present

## 2014-10-06 DIAGNOSIS — R740 Nonspecific elevation of levels of transaminase and lactic acid dehydrogenase [LDH]: Secondary | ICD-10-CM

## 2014-10-06 DIAGNOSIS — E876 Hypokalemia: Secondary | ICD-10-CM

## 2014-10-06 DIAGNOSIS — N179 Acute kidney failure, unspecified: Secondary | ICD-10-CM | POA: Diagnosis present

## 2014-10-06 DIAGNOSIS — A419 Sepsis, unspecified organism: Secondary | ICD-10-CM

## 2014-10-06 DIAGNOSIS — R6510 Systemic inflammatory response syndrome (SIRS) of non-infectious origin without acute organ dysfunction: Secondary | ICD-10-CM | POA: Diagnosis present

## 2014-10-06 LAB — COMPREHENSIVE METABOLIC PANEL WITH GFR
ALT: 1019 U/L — ABNORMAL HIGH (ref 17–63)
AST: 715 U/L — ABNORMAL HIGH (ref 15–41)
Albumin: 2.9 g/dL — ABNORMAL LOW (ref 3.5–5.0)
Alkaline Phosphatase: 71 U/L (ref 38–126)
Anion gap: 6 (ref 5–15)
BUN: 28 mg/dL — ABNORMAL HIGH (ref 6–20)
CO2: 28 mmol/L (ref 22–32)
Calcium: 8 mg/dL — ABNORMAL LOW (ref 8.9–10.3)
Chloride: 101 mmol/L (ref 101–111)
Creatinine, Ser: 3 mg/dL — ABNORMAL HIGH (ref 0.61–1.24)
GFR calc Af Amer: 31 mL/min — ABNORMAL LOW
GFR calc non Af Amer: 27 mL/min — ABNORMAL LOW
Glucose, Bld: 98 mg/dL (ref 65–99)
Potassium: 3.8 mmol/L (ref 3.5–5.1)
Sodium: 135 mmol/L (ref 135–145)
Total Bilirubin: 1.2 mg/dL (ref 0.3–1.2)
Total Protein: 5.2 g/dL — ABNORMAL LOW (ref 6.5–8.1)

## 2014-10-06 LAB — PHOSPHORUS: Phosphorus: 3.7 mg/dL (ref 2.5–4.6)

## 2014-10-06 LAB — ANCA TITERS: Atypical P-ANCA titer: <1:20 {titer}

## 2014-10-06 LAB — CK: Total CK: 30962 U/L — ABNORMAL HIGH (ref 49–397)

## 2014-10-06 LAB — CBC
HCT: 37.1 % — ABNORMAL LOW (ref 39.0–52.0)
Hemoglobin: 12.6 g/dL — ABNORMAL LOW (ref 13.0–17.0)
MCH: 27.9 pg (ref 26.0–34.0)
MCHC: 34 g/dL (ref 30.0–36.0)
MCV: 82.3 fL (ref 78.0–100.0)
Platelets: 123 10*3/uL — ABNORMAL LOW (ref 150–400)
RBC: 4.51 MIL/uL (ref 4.22–5.81)
RDW: 13.9 % (ref 11.5–15.5)
WBC: 7.1 10*3/uL (ref 4.0–10.5)

## 2014-10-06 LAB — MAGNESIUM: Magnesium: 2.1 mg/dL (ref 1.7–2.4)

## 2014-10-06 MED ORDER — OXYCODONE HCL 5 MG PO TABS
5.0000 mg | ORAL_TABLET | ORAL | Status: DC | PRN
  Administered 2014-10-06 – 2014-10-08 (×11): 5 mg via ORAL
  Filled 2014-10-06 (×12): qty 1

## 2014-10-06 NOTE — Clinical Social Work Note (Signed)
Clinical Social Work Assessment  Patient Details  Name: Charles Ortega MRN: [id number] Date of Birth: 1985-08-29  Date of referral:  10/06/14               Reason for consult:  Facility Placement                Permission sought to share information with:    Permission granted to share information::  Yes, Verbal Permission Granted  Name::        Agency::     Relationship::     Contact Information:     Housing/Transportation Living arrangements for the past 2 months:  Homeless Shelter Source of Information:  Patient Patient Interpreter Needed:  None Criminal Activity/Legal Involvement Pertinent to Current Situation/Hospitalization:  No - Comment as needed Significant Relationships:  Other(Comment) (grandparent) Lives with:  Other (Comment) (shelter resident) Do you feel safe going back to the place where you live?  Yes Need for family participation in patient care:  No (Coment)  Care giving concerns: Pt has been a resident at [facility] for the past 2 months- does not have anyone who can care for him at the shelter   Social Worker assessment / plan:  CSW spoke with pt about plan at time of DC- explained that we are not able to send him to a SNF due to current mobility and lack of insurance.  CSW spoke with pt about drug rehab resources.  Employment status:  [employer] information:  Self Pay (Medicaid Pending) PT Recommendations:  Skilled Nursing Facility Information / Referral to community resources:  Outpatient Substance Abuse Treatment Options, Residential Substance Abuse Treatment Options, Shelter  Patient/Family's Response to care: Pt is agreeable to rehab options- will consider [facility] for inpatient stay- states he has participated in outpatient rehab in the past and did not think it was affective.  Pt is worried about discharging from the hospital too soon- does not want to leave the hospital with his current pain and mobility levels- is concerned  about his ability to keep his job due to this unexpected hospitalization- CSW encouraged pt to call the shelter and his job to inform of his condition and why he is not checking in.    Pt has grandmother who lives locally- pt reports that he is unable to live with grandmother currently but that he had been discussing with his grandmother the possibility that she will take over his money for the time being so that he is not tempted to buy drugs.  Patient/Family's Understanding of and Emotional Response to Diagnosis, Current Treatment, and Prognosis:  Pt seems somewhat irritated with himself about his current predicament- expresses concern about having potentially screwing up his job and his housing situation due to his most recent drug use.  Emotional Assessment Appearance:  Appears stated age Attitude/Demeanor/Rapport:    Affect (typically observed):  Appropriate Orientation:  Oriented to Self, Oriented to Place, Oriented to  Time, Oriented to Situation Alcohol / Substance use:  Illicit Drugs Psych involvement (Current and /or in the community):  No (Comment)  Discharge Needs  Concerns to be addressed:  Home Safety Concerns, Homelessness, Substance Abuse Concerns Readmission within the last 30 days:    Current discharge risk:  Lack of support system, Homeless Barriers to Discharge:  Homeless with medical needs, Continued Medical Work up   [employer], [place], LCSW 10/06/2014, 3:56 PM

## 2014-10-06 NOTE — Progress Notes (Signed)
Occupational Therapy Treatment Patient Details Name: Charles Ortega MRN: [id number] DOB: December 12, 1985 Today's Date: 10/06/2014    History of present illness Charles Ortega is a 29 y.o. male who [place] the [place] on Heroin and using cocaine 24 hours earlier, He reports having increased pain all over and weaknessin both legs, pt with rhabdomyolysis   OT comments  This 29 yo male making progress slowly due to continued weakness in Bil LEs (will need to Mod I to go back to [facility])--if does not reach this during hospital LOS then will need SNF until he can get to that point. Will continue to follow.  Follow Up Recommendations  SNF (if does not progress to Mod I to go back to [facility])    Equipment Recommendations  None recommended by OT (pt reports bathroom facilities at [facility] are handicapped accessible with grab bars)       Precautions / Restrictions Precautions Precautions: Fall Restrictions Weight Bearing Restrictions: No       Mobility Bed Mobility Overal bed mobility: Modified Independent             General bed mobility comments: Pt up in recliner upon arrival  Transfers Overall transfer level: Needs assistance Equipment used: Rolling walker (2 wheeled) Transfers: Sit to/from Stand Sit to Stand: Min guard         General transfer comment: cues for hand placement for safety     Balance Overall balance assessment: Needs assistance   Sitting balance-Leahy Scale: Good       Standing balance-Leahy Scale: Poor                     ADL Overall ADL's : Needs assistance/impaired     Grooming: Wash/dry hands;Min guard;Standing Grooming Details (indicate cue type and reason): pt propping up against sink for stability due to weakness in hips and knees                 Toilet Transfer: Min guard;Ambulation;RW;Comfort height toilet;Grab bars   Toileting- Clothing  Manipulation and Hygiene: Min guard;Sit to/from stand (use of grab bar)                          Cognition   Behavior During Therapy: WFL for tasks assessed/performed Overall Cognitive Status: Within Functional Limits for tasks assessed                         Exercises Other Exercises: Pt able to tell me what LE exercises PT had educated him on. The only thing I added is him using a sheet to help get full knee extension in sitting and hip flexion while supine for straight leg raises with sheet only A'ing once he could not actively move his leg.           Pertinent Vitals/ Pain       Pain Assessment: No/denies pain  Pain Intervention(s): Premedicated before session         Frequency Min 2X/week     Progress Toward Goals  OT Goals(current goals can now be found in the care plan section)  Progress towards OT goals: Progressing toward goals     Plan Discharge plan needs to be updated       End of Session Equipment Utilized During Treatment: Rolling walker   Activity Tolerance Patient tolerated treatment well  Patient Left in chair;with call bell/phone within reach   Nurse Communication          Time: 1015-1036 OT Time Calculation (min): 21 min  Charges: OT General Charges $OT Visit: 1 Procedure OT Treatments $Self Care/Home Management : 8-22 mins  Evette Georges [phone] 10/06/2014, 10:51 AM

## 2014-10-06 NOTE — Progress Notes (Signed)
Physical Therapy Treatment Patient Details Name: Charles Ortega MRN: [id number] DOB: 01-23-1986 Today's Date: 10/06/2014    History of Present Illness Charles Ortega is a 29 y.o. male who [place] the [place] on Heroin and using cocaine 24 hours earlier, He reports having increased pain all over and weaknessin both legs, pt with rhabdomyolysis    PT Comments    Pt with great improvement with gait with continued proximal weakness and decreased control bil LE. Pt educated for HEP, RW use and gait with frank discussion about discharge planning and progression. Will continue to follow in hopes pt will achieve modified independent for return to shelter pending LOS.   Follow Up Recommendations  SNF (if unable to achieve mod I for return to shelter)     Equipment Recommendations  Rolling walker with 5" wheels    Recommendations for Other Services       Precautions / Restrictions Precautions Precautions: Fall    Mobility  Bed Mobility Overal bed mobility: Modified Independent             General bed mobility comments: Use of rail  Transfers Overall transfer level: Needs assistance   Transfers: Sit to/from Stand Sit to Stand: Min guard         General transfer comment: cues for hand placement, sequence and safety with increased time to transition bil hands to RW, continues to utilize knee extenstion and bil UE assist to fully stand  Ambulation/Gait Ambulation/Gait assistance: Min assist Ambulation Distance (Feet): 180 Feet Assistive device: Rolling walker (2 wheeled) Gait Pattern/deviations: Step-through pattern;Decreased stride length   Gait velocity interpretation: Below normal speed for age/gender General Gait Details: increases bil UE support to maintain standing with cues for position in RW   Stairs            Wheelchair Mobility    Modified Rankin (Stroke Patients Only)       Balance Overall  balance assessment: Needs assistance   Sitting balance-Leahy Scale: Good       Standing balance-Leahy Scale: Poor                      Cognition Arousal/Alertness: Awake/alert Behavior During Therapy: WFL for tasks assessed/performed Overall Cognitive Status: Within Functional Limits for tasks assessed                      Exercises General Exercises - Lower Extremity Long Arc QuadBarbaraann Ortega;Both;15 reps Hip ABduction/ADduction: AROM;Seated;Both;10 reps Hip Flexion/Marching: AROM;Seated;Both;10 reps Toe Raises: AROM;Seated;Both;20 reps Heel Raises: AROM;Seated;Both;20 reps    General Comments        Pertinent Vitals/Pain Pain Score: 7  Pain Location: bil hips Pain Descriptors / Indicators: Aching;Sore Pain Intervention(s): Patient requesting pain meds-RN notified;Repositioned;Limited activity within patient's tolerance  HR 87-102 sats 98% RA     Home Living                      Prior Function            PT Goals (current goals can now be found in the care plan section) Progress towards PT goals: Progressing toward goals    Frequency  Min 5X/week    PT Plan Discharge plan needs to be updated    Co-evaluation             End of Session Equipment Utilized During Treatment: Gait belt Activity Tolerance: Patient tolerated  treatment well Patient left: in chair;with call bell/phone within reach     Time: 0752-0816 PT Time Calculation (min) (ACUTE ONLY): 24 min  Charges:  $Gait Training: 8-22 mins $Therapeutic Exercise: 8-22 mins                    G Codes:      Charles Ortega 10-07-2014, 10:14 AM Charles Ortega, PT [phone]

## 2014-10-06 NOTE — Progress Notes (Addendum)
Report called to RN for [phone].  Pt aware of transfer.  All belongings with pt.  VSS.  SR.  NAD. Pt to transfer via wheelchair.

## 2014-10-06 NOTE — Progress Notes (Signed)
[facility] TEAM 1 - Stepdown/ICU TEAM Progress Note  Charles Ortega RNH:[id number] DOB: 08-12-1985 DOA: 10/03/2014 PCP: No primary care provider on file.  Admit HPI / Brief Narrative: 29 y.o. WM PMHx substance-abuse   Presented to the ED with altered mental status. Per report had been using heroin and cocaine for 24 hours. In ED CK > 50 K, unable to move LE's.    HPI/Subjective: 8/30 A/O 4, NAD, sitting in chair comfortably.  Assessment/Plan: Acute encephalopathy - likely from OD on heroin, cocaine, and narcotics - Encephalopathy has resolved.   SIRS  - On admission pt met criteria for SIRS with HR 118, RR 30, WBC 22K, lactic acid > 3 - No clear source of infection - given IVDA pt is at high risk for staph, MRSA infections - No evidence of infection therefore discontinue antibiotics and follow -Acute hepatitis panel, blood cultures and HIV negative to date  Rhabdomyolysis - from prolonged immobility and drug use - CK > 50,000 for 3rd day in a row - continue high volume IVF; normal saline at 275ml/hr - monitor CK level daily   Shock liver -Liver enzymes improving, monitor daily  Acute kidney injury - secondary to rhabdo - high risk for progression to permanent renal failure - discussed these ramifications with patient and his grandmother.   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  - appreciate Neurology eval  Hyponatremia - Due to hypovolemia - corrected  Transaminitis  - likely shock liver - slowly improving - continue to hydrate  Polysubstance abuse - heroin, cocaine, opiates -Discussed at length the sequela of continuing to use these substances to include liver failure, kidney failure, death. -Have DC'd IV narcotics given patient's history would not restart -Will continue PRN low-dose  narcotics to prevent withdrawal.  Thrombocytopenia - no signs of active bleeding - Stable  Goals of care -Myself and NCM Henrietta spoke at length with patient and mother. Per mother no one in the family will allow patient to live with them. In addition patient states he has no friends which will allow him to live with them. Does not have Medicare Medicaid therefore does not qualify for for assistance. -NCM Henrietta spoke with CSW [place] and patient does not qualify for inpatient drug rehabilitation   Code Status: FULL Family Communication: Grandmother present at time of exam Disposition Plan: ??    Consultants: Dr.McNeill Adrian Prows (neurology)  Procedure/Significant Events: NA   Culture 8/28 blood left AC/RIGHT HAND NGTD 8/28 urine negative   Antibiotics: Zosyn 8/28 > 8/29 Septra 8/28 > 8/29  DVT prophylaxis: SCDs   Devices    LINES / TUBES:      Continuous Infusions: . sodium chloride 200 mL/hr at 10/06/14 1204    Objective: VITAL SIGNS: Temp: 97.4 F (36.3 C) (08/30 0822) Temp Source: Oral (08/30 0822) BP: 134/67 mmHg (08/30 0419) Pulse Rate: 81 (08/30 0822) SPO2; FIO2:   Intake/Output Summary (Last 24 hours) at 10/06/14 1251 Last data filed at 10/06/14 0800  Gross per 24 hour  Intake 3866.67 ml  Output   3175 ml  Net 691.67 ml     Exam: General: A/O 4, NAD, sitting in chair comfortably, No acute respiratory distress Eyes: Negative headache, eye pain, double vision,negative scleral hemorrhage ENT: Negative Runny nose, negative ear pain, negative tinnitus, negative gingival bleeding, Neck:  Negative scars, masses, torticollis,  lymphadenopathy, JVD Lungs: Clear to auscultation bilaterally without wheezes or crackles Cardiovascular: Regular rate and rhythm without murmur gallop or rub normal S1 and S2 Abdomen:negative abdominal pain, negative dysphagia, nondistended, positive soft, bowel sounds, no rebound, no ascites, no appreciable  mass Extremities: No significant cyanosis, clubbing, or edema bilateral lower extremities; multiple lacerations on bilateral hands and arms consistent with fall and unknown amount of time on floor unconscious. Psychiatric:  Negative depression, negative anxiety, negative fatigue, negative mania, patient with extremely poor understanding of his disease process although NCM Henrietta and myself try to engage patient very difficult to elicit answers. Neurologic:  Cranial nerves II through XII intact, tongue/uvula midline, all extremities muscle strength 5/5, sensation intact throughout,  negative dysarthria, negative expressive aphasia, negative receptive aphasia.   Data Reviewed: Basic Metabolic Panel:  Recent Labs Lab 10/03/14 1843 10/04/14 0620 10/05/14 0239 10/06/14 0233  NA 132* 135 137 135  K 5.1 3.7 3.4* 3.8  CL 98* 103 99* 101  CO2 20* $Remov'22 29 28  'iEbCTc$ GLUCOSE 110* 118* 123* 98  BUN 41* 45* 40* 28*  CREATININE 3.00* 3.33* 3.45* 3.00*  CALCIUM 7.9* 7.6* 7.9* 8.0*  MG  --   --   --  2.1  PHOS  --   --   --  3.7   Liver Function Tests:  Recent Labs Lab 10/03/14 1843 10/05/14 0239 10/06/14 0233  AST 2696* 1048* 715*  ALT 2312* 1282* 1019*  ALKPHOS 107 80 71  BILITOT 0.9 1.2 1.2  PROT 7.3 5.2* 5.2*  ALBUMIN 4.5 3.0* 2.9*   No results for input(s): LIPASE, AMYLASE in the last 168 hours. No results for input(s): AMMONIA in the last 168 hours. CBC:  Recent Labs Lab 10/03/14 1843 10/04/14 0620 10/05/14 0239 10/06/14 0233  WBC 22.4* 12.0* 9.4 7.1  NEUTROABS 20.1*  --   --   --   HGB 16.6 14.1 14.1 12.6*  HCT 48.6 40.7 40.5 37.1*  MCV 83.6 82.1 80.2 82.3  PLT 179 122* 135* 123*   Cardiac Enzymes:  Recent Labs Lab 10/03/14 1843 10/04/14 0620 10/05/14 0239 10/06/14 0233  CKTOTAL >50000* >50000* >50000* 39030*   BNP (last 3 results) No results for input(s): BNP in the last 8760 hours.  ProBNP (last 3 results) No results for input(s): PROBNP in the last 8760  hours.  CBG: No results for input(s): GLUCAP in the last 168 hours.  Recent Results (from the past 240 hour(s))  MRSA PCR Screening     Status: None   Collection Time: 10/04/14  1:58 AM  Result Value Ref Range Status   MRSA by PCR NEGATIVE NEGATIVE Final    Comment:        The GeneXpert MRSA Assay (FDA approved for NASAL specimens only), is one component of a comprehensive MRSA colonization surveillance program. It is not intended to diagnose MRSA infection nor to guide or monitor treatment for MRSA infections.   Culture, blood (routine x 2)     Status: None (Preliminary result)   Collection Time: 10/04/14  1:00 PM  Result Value Ref Range Status   Specimen Description BLOOD LEFT ANTECUBITAL  Final   Special Requests BOTTLES DRAWN AEROBIC AND ANAEROBIC 5CC  Final   Culture NO GROWTH < 24 HOURS  Final   Report Status PENDING  Incomplete  Culture, blood (routine x 2)     Status: None (Preliminary result)   Collection Time: 10/04/14  1:20 PM  Result Value Ref Range Status   Specimen Description BLOOD BLOOD  RIGHT HAND  Final   Special Requests BOTTLES DRAWN AEROBIC ONLY 1CC  Final   Culture NO GROWTH < 24 HOURS  Final   Report Status PENDING  Incomplete  Urine culture     Status: None   Collection Time: 10/04/14  2:32 PM  Result Value Ref Range Status   Specimen Description URINE, RANDOM  Final   Special Requests NONE  Final   Culture NO GROWTH 1 DAY  Final   Report Status 10/05/2014 FINAL  Final     Studies:  Recent x-ray studies have been reviewed in detail by the Attending Physician  Scheduled Meds:  Scheduled Meds: . sodium chloride  3 mL Intravenous Q12H    Time spent on care of this patient: 40 mins   WOODS, Geraldo Docker , MD  [facility] Office  [phone] Pager - [phone]  On-Call/Text Page:      [email or web address]      password [email or web address]  If 7PM-7AM, please contact night-coverage [email or web address] Password [email or web address] 10/06/2014, 12:51 PM   LOS: 3 days    Care during the described time interval was provided by me .  I have reviewed this patient's available data, including medical history, events of note, physical examination, and all test results as part of my evaluation. I have personally reviewed and interpreted all radiology studies.   Dia Crawford, MD [phone] Pager

## 2014-10-07 DIAGNOSIS — M6282 Rhabdomyolysis: Principal | ICD-10-CM

## 2014-10-07 DIAGNOSIS — N179 Acute kidney failure, unspecified: Secondary | ICD-10-CM

## 2014-10-07 DIAGNOSIS — G9340 Encephalopathy, unspecified: Secondary | ICD-10-CM

## 2014-10-07 DIAGNOSIS — R740 Nonspecific elevation of levels of transaminase and lactic acid dehydrogenase [LDH]: Secondary | ICD-10-CM

## 2014-10-07 LAB — COMPREHENSIVE METABOLIC PANEL
ALBUMIN: 3 g/dL — AB (ref 3.5–5.0)
ALK PHOS: 61 U/L (ref 38–126)
ALT: 671 U/L — AB (ref 17–63)
AST: 351 U/L — AB (ref 15–41)
Anion gap: 3 — ABNORMAL LOW (ref 5–15)
BUN: 18 mg/dL (ref 6–20)
CALCIUM: 8.9 mg/dL (ref 8.9–10.3)
CHLORIDE: 112 mmol/L — AB (ref 101–111)
CO2: 25 mmol/L (ref 22–32)
CREATININE: 2.34 mg/dL — AB (ref 0.61–1.24)
GFR calc non Af Amer: 37 mL/min — ABNORMAL LOW (ref >60)
GFR, EST AFRICAN AMERICAN: 43 mL/min — AB (ref >60)
GLUCOSE: 96 mg/dL (ref 65–99)
Potassium: 4.5 mmol/L (ref 3.5–5.1)
SODIUM: 140 mmol/L (ref 135–145)
Total Bilirubin: 1.2 mg/dL (ref 0.3–1.2)
Total Protein: 5.2 g/dL — ABNORMAL LOW (ref 6.5–8.1)

## 2014-10-07 LAB — CK: CK TOTAL: 12760 U/L — AB (ref 49–397)

## 2014-10-07 NOTE — Progress Notes (Signed)
CSW provided pt with apartment resources- pt reports feeling better/ more mobile today.  Pt still planning on follow up with inpatient rehab program at time of DC.  CSW signing off- please reconsult if further needs arise.  Merlyn Lot, LCSWA Clinical Social Worker [phone]

## 2014-10-07 NOTE — Progress Notes (Signed)
Occupational Therapy Treatment Patient Details Name: Elan Brainerd MRN: [id number] DOB: 06/21/1985 Today's Date: 10/07/2014    History of present illness Damaria Phi Avans is a 29 y.o. male who presented to the ED [place] after [place] up on Heroin and using cocaine 24 hours earlier, He reports having increased pain all over and weaknessin both legs, pt with rhabdomyolysis   OT comments  Pt is progressing with ADLs - requires min guard to min A.  Demonstrates difficulty moving sit to stand from low surfaces as needed for toileting and with balance for clothing manipulation.  Will continue to follow.   Follow Up Recommendations  SNF    Equipment Recommendations  None recommended by OT    Recommendations for Other Services      Precautions / Restrictions Precautions Precautions: Fall       Mobility Bed Mobility Overal bed mobility: Independent                Transfers Overall transfer level: Needs assistance   Transfers: Sit to/from Stand;Stand Pivot Transfers Sit to Stand: Min guard;Min assist Stand pivot transfers: Min assist            Balance Overall balance assessment: Needs assistance Sitting-balance support: Feet supported Sitting balance-Leahy Scale: Good     Standing balance support: Bilateral upper extremity supported;During functional activity Standing balance-Leahy Scale: Poor                     ADL Overall ADL's : Needs assistance/impaired             Lower Body Bathing: Sit to/from stand;Minimal assistance       Lower Body Dressing: Sit to/from stand;Minimal assistance   Toilet Transfer: Minimal assistance;Ambulation;Regular Toilet;Grab bars;RW Statistician Details (indicate cue type and reason): Pt with difficulty moving sit to stand from low surfaces- heavy reliance on UEs  Toileting- Clothing Manipulation and Hygiene: Min guard;Sit to/from stand       Functional mobility during ADLs: Min  guard;Rolling walker General ADL Comments: worked on sit to stand as needed for ADLs       Emergency planning/management officer   Behavior During Therapy: [facility] for tasks assessed/performed Overall Cognitive Status: Within Functional Limits for tasks assessed                       Extremity/Trunk Assessment               Exercises     Shoulder Instructions       General Comments      Pertinent Vitals/ Pain       Pain Assessment: Faces Faces Pain Scale: Hurts little more Pain Location: hips Pain Descriptors / Indicators: Sore Pain Intervention(s): Monitored during session  Home Living                                          Prior Functioning/Environment              Frequency Min 2X/week     Progress Toward Goals  OT Goals(current goals can now be found in the care plan section)  Progress towards OT goals: Progressing toward goals  Plan Discharge plan needs to be updated    Co-evaluation                 End of Session Equipment Utilized During Treatment: Rolling walker   Activity Tolerance Patient tolerated treatment well   Patient Left in bed;with call bell/phone within reach   Nurse Communication Mobility status        Time: 1610-9604 OT Time Calculation (min): 24 min  Charges: OT General Charges $OT Visit: 1 Procedure OT Treatments $Therapeutic Activity: 8-22 mins  Eydan Chianese M 10/07/2014, 4:57 PM

## 2014-10-07 NOTE — Progress Notes (Signed)
Patient Demographics  Charles Ortega, is a 29 y.o. male, DOB - April 14, 1985, QIO:[id number]  Admit date - 10/03/2014   Admitting Physician Theressa Millard, MD  Outpatient Primary MD for the patient is No primary care provider on file.  LOS - 4   Chief Complaint  Patient presents with  . Numbness    legs       Admit HPI / Brief Narrative: 29 y.o. male w/ no signif hx who presented to the ED with altered mental status. Per report had been using heroin and cocaine for 24 hours. In ED CK > 50 K, unable to move LE's.   Subjective:   Charles Ortega today has, No headache, No chest pain, No abdominal pain - No Nausea, No new weakness tingling or numbness, No Cough - SOB. Reports his weakness has significantly subsided.  Assessment & Plan    Principal Problem:   Rhabdomyolysis Active Problems:   AKI (acute kidney injury)   Elevated transaminase level   Polysubstance abuse   Leukocytosis   Acute encephalopathy   SIRS (systemic inflammatory response syndrome)   Shock liver   Acute renal failure syndrome   Hypokalemia   Mononeuropathy   Hyponatremia   Transaminitis  Acute encephalopathy - likely from OD on heroin and cocaine - Resolved, mental status back to baseline  SIRS  - pt met criteria for SIRS with HR 118, RR 30, WBC 22K, lactic acid > 3 - No clear source of infection - given IVDA pt is at high risk for staph, MRSA infections, blood cultures negative to date. - No evidence of infection therefore anteverted were stopped.  Rhabdomyolysis - from prolonged immobility and drug use - CK > 50,000 for 3rd day in a row, currently trending down. 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  Polysubstance abuse - heroin, cocaine - d counseled iscuss cessation once pt more medically stable - social work consultation to assist  Thrombocytopenia - no signs of active bleeding - Stable  Code Status: Full  Family Communication: None at bedside  Disposition Plan: For discharge in  24-48 hours as low renal function continues to improve  Procedures  none   Consults   neurology   Medications  Scheduled Meds: . sodium chloride  3 mL Intravenous Q12H   Continuous Infusions: . sodium chloride 200 mL/hr at 10/07/14 0947   PRN Meds:.acetaminophen **OR** acetaminophen,  alum & mag hydroxide-simeth, LORazepam, methocarbamol, ondansetron **OR** ondansetron (ZOFRAN) IV, oxyCODONE  DVT Prophylaxis- Heparin -  Lab Results  Component Value Date   PLT 123* 10/06/2014    Antibiotics    Anti-infectives    Start     Dose/Rate Route Frequency Ordered Stop   10/04/14 1300  piperacillin-tazobactam (ZOSYN) IVPB 3.375 g  Status:  Discontinued     3.375 g 12.5 mL/hr over 240 Minutes Intravenous Every 8 hours 10/04/14 1219 10/05/14 1542   10/04/14 1230  sulfamethoxazole-trimethoprim (BACTRIM DS,SEPTRA DS) 800-160 MG per tablet 1 tablet  Status:  Discontinued     1 tablet Oral Every 12 hours 10/04/14 1149 10/05/14 1546          Objective:    Filed Vitals:   10/06/14 2131 10/07/14 0020 10/07/14 0419 10/07/14 0917  BP: 123/76 134/76 109/69 129/69  Pulse: 70 68 59 75  Temp: 98.2 F (36.8 C)   98.8 F (37.1 C)  TempSrc: Oral   Oral  Resp: 18   18  Height: $Remove'5\' 11"'uLNLZYO$  (1.803 m)     Weight: 78.155 kg (172 lb 4.8 oz)     SpO2: 100%   100%    Wt Readings from Last 3 Encounters:  10/06/14 78.155 kg (172 lb 4.8 oz)  10/03/14 77.111 kg (170 lb)  10/03/14 77.111 kg (170 lb)     Intake/Output Summary (Last 24 hours) at 10/07/14 1330 Last data filed at 10/07/14 1113  Gross per 24 hour  Intake   2060 ml  Output   4250 ml  Net  -2190 ml     Physical Exam  Awake Alert, Oriented X 3, No new F.N deficits, Normal affect Amelia.AT,PERRAL Supple Neck,No JVD, No cervical lymphadenopathy appriciated.  Symmetrical Chest wall movement, Good air movement bilaterally, CTAB RRR,No Gallops,Rubs or new Murmurs, No Parasternal Heave +ve B.Sounds, Abd Soft, No tenderness, No organomegaly appriciated, No rebound - guarding or rigidity. No Cyanosis, Clubbing or edema, No new Rash or bruise     Data Review   Micro Results Recent Results (from the past 240 hour(s))  MRSA PCR Screening     Status: None   Collection Time: 10/04/14  1:58 AM  Result Value Ref Range Status   MRSA by PCR NEGATIVE NEGATIVE Final    Comment:        The GeneXpert MRSA Assay (FDA approved for NASAL specimens only), is one component of a comprehensive MRSA colonization surveillance program. It is not intended to diagnose MRSA infection nor to guide or monitor treatment for MRSA infections.   Culture, blood (routine x 2)     Status: None (Preliminary result)   Collection Time: 10/04/14  1:00 PM  Result Value Ref Range Status   Specimen Description BLOOD LEFT ANTECUBITAL  Final   Special Requests BOTTLES DRAWN AEROBIC AND ANAEROBIC 5CC  Final   Culture NO GROWTH 3 DAYS  Final   Report Status PENDING  Incomplete  Culture, blood (routine x 2)     Status: None  (Preliminary result)   Collection Time: 10/04/14  1:20 PM  Result Value Ref Range Status   Specimen Description BLOOD BLOOD RIGHT HAND  Final   Special Requests BOTTLES DRAWN AEROBIC ONLY 1CC  Final   Culture NO GROWTH 3 DAYS  Final   Report Status PENDING  Incomplete  Urine culture     Status: None   Collection Time: 10/04/14  2:32 PM  Result Value Ref Range Status   Specimen Description URINE, RANDOM  Final   Special Requests NONE  Final   Culture NO GROWTH 1 DAY  Final   Report Status 10/05/2014 FINAL  Final    Radiology Reports Dg Chest 2 View  10/03/2014   CLINICAL DATA:  Abnormal x-ray. Airspace opacities suggested on MRI thoracic spine. Patient reports no chest pain or complaints. Current smoker.  EXAM: CHEST  2 VIEW  COMPARISON:  06/24/2006  FINDINGS: The heart size and mediastinal contours are within normal limits. Both lungs are clear. The visualized skeletal structures are unremarkable.  IMPRESSION: No active cardiopulmonary disease. MRI findings may have been related to motion artifact.   Electronically Signed   By: Lucienne Capers M.D.   On: 10/03/2014 22:44   Mr Thoracic Spine W Wo Contrast  10/03/2014   CLINICAL DATA:  Lower extremity numbness, hip and back pain. Difficulty walking, weakness. Found in park, one-day of amnesia.  EXAM: MRI THORACIC AND LUMBAR SPINE WITHOUT AND WITH CONTRAST  TECHNIQUE: Multiplanar and multiecho pulse sequences of the thoracic and lumbar spine were obtained without and with intravenous contrast.  CONTRAST:  67mL MULTIHANCE GADOBENATE DIMEGLUMINE 529 MG/ML IV SOLN  COMPARISON:  None.  FINDINGS: MR THORACIC SPINE FINDINGS  Moderately motion degraded examination.  The thoracic alignment is normal. There are no marrow space abnormalities and there is no fracture or mass lesion. No abnormal osseous or intradiscal enhancement. Patchy airspace opacities RIGHT lung.  The cord has normal signal and morphology. No abnormal cord, leptomeningeal nor epidural  enhancement. The disks appear normal and there is no disc protrusion, spondylosis, or spinal stenosis.  MR LUMBAR SPINE FINDINGS  Moderately motion degraded examination. Lumbar vertebral bodies and posterior elements are intact and aligned with maintenance of the lumbar lordosis. Intervertebral disc morphology is maintained. Decreased T2 signal within the L4-5 disc compatible with mild desiccation. Minimal chronic discogenic endplate changes M2-5. No STIR signal abnormality to suggest acute osseous process. No abnormal osseous or intradiscal enhancement.  Conus medullaris terminates at T12-L1 and appears normal in morphology and signal characteristics. No abnormal cord, leptomeningeal or epidural enhancement. Limited by patient positioning and motion though, the there appears to be T2 bright signal within the RIGHT iliopsoas muscles at the level of the pelvis.  Level by level evaluation:  L1-2, L2-3, L3-4: No significant disc bulge, canal stenosis or neural foraminal narrowing.  L4-5: Small central disc protrusion, enhancing annular fissure. Minimal facet arthropathy and ligamentum flavum redundancy without canal stenosis or definite neural foraminal narrowing.  L5-S1: Tiny broad-based LEFT central disc protrusion without canal stenosis or neural foraminal narrowing.  IMPRESSION: MRI THORACIC SPINE: Motion degraded examination with generally normal MRI of the thoracic spine with and without contrast.  Included view of the chest demonstrates patchy airspace opacities RIGHT lung for which dedicated chest radiograph is recommended.  MRI LUMBAR SPINE: Small L4-5 and L5-S1 disc protrusions, L4-5 annular fissure. No neurocompressive changes.  No acute fracture, malalignment or suspicious enhancement.  Suspected edema/strain of the RIGHT iliopsoas muscles though, this could alternatively represent motion artifact.   Electronically Signed   By: Elon Alas M.D.   On: 10/03/2014 21:56   Mr Lumbar Spine W Wo  Contrast  10/03/2014   CLINICAL DATA:  Lower extremity numbness, hip and back pain. Difficulty walking, weakness. Found in park, one-day of amnesia.  EXAM: MRI THORACIC AND LUMBAR SPINE WITHOUT AND WITH CONTRAST  TECHNIQUE: Multiplanar and multiecho pulse sequences of the thoracic and lumbar spine were obtained without and with intravenous contrast.  CONTRAST:  67mL  MULTIHANCE GADOBENATE DIMEGLUMINE 529 MG/ML IV SOLN  COMPARISON:  None.  FINDINGS: MR THORACIC SPINE FINDINGS  Moderately motion degraded examination.  The thoracic alignment is normal. There are no marrow space abnormalities and there is no fracture or mass lesion. No abnormal osseous or intradiscal enhancement. Patchy airspace opacities RIGHT lung.  The cord has normal signal and morphology. No abnormal cord, leptomeningeal nor epidural enhancement. The disks appear normal and there is no disc protrusion, spondylosis, or spinal stenosis.  MR LUMBAR SPINE FINDINGS  Moderately motion degraded examination. Lumbar vertebral bodies and posterior elements are intact and aligned with maintenance of the lumbar lordosis. Intervertebral disc morphology is maintained. Decreased T2 signal within the L4-5 disc compatible with mild desiccation. Minimal chronic discogenic endplate changes X3-2. No STIR signal abnormality to suggest acute osseous process. No abnormal osseous or intradiscal enhancement.  Conus medullaris terminates at T12-L1 and appears normal in morphology and signal characteristics. No abnormal cord, leptomeningeal or epidural enhancement. Limited by patient positioning and motion though, the there appears to be T2 bright signal within the RIGHT iliopsoas muscles at the level of the pelvis.  Level by level evaluation:  L1-2, L2-3, L3-4: No significant disc bulge, canal stenosis or neural foraminal narrowing.  L4-5: Small central disc protrusion, enhancing annular fissure. Minimal facet arthropathy and ligamentum flavum redundancy without canal  stenosis or definite neural foraminal narrowing.  L5-S1: Tiny broad-based LEFT central disc protrusion without canal stenosis or neural foraminal narrowing.  IMPRESSION: MRI THORACIC SPINE: Motion degraded examination with generally normal MRI of the thoracic spine with and without contrast.  Included view of the chest demonstrates patchy airspace opacities RIGHT lung for which dedicated chest radiograph is recommended.  MRI LUMBAR SPINE: Small L4-5 and L5-S1 disc protrusions, L4-5 annular fissure. No neurocompressive changes.  No acute fracture, malalignment or suspicious enhancement.  Suspected edema/strain of the RIGHT iliopsoas muscles though, this could alternatively represent motion artifact.   Electronically Signed   By: Elon Alas M.D.   On: 10/03/2014 21:56   US Abdomen Complete  10/05/2014   CLINICAL DATA:  Altered mental status.  Transaminitis  EXAM: ULTRASOUND ABDOMEN COMPLETE  COMPARISON:  None.  FINDINGS: Gallbladder: Sludge identified within the gallbladder. No gallbladder wall thickening or pericholecystic fluid.  Common bile duct: Diameter: 4.0 mm  Liver: No focal lesion identified. The echotexture appears heterogeneous.  IVC: No abnormality visualized.  Pancreas: Visualized portion unremarkable.  Spleen: Size and appearance within normal limits.  Right Kidney: Length: 11.9 cm. Increased parenchymal echogenicity. Echogenicity within normal limits. No mass or hydronephrosis visualized.  Left Kidney: Length: 12.3 cm. Increased parenchymal echogenicity. Echogenicity within normal limits. No mass or hydronephrosis visualized.  Abdominal aorta: No aneurysm visualized.  Other findings: None.  IMPRESSION: 1. Bilateral echogenic kidneys suggestive of chronic medical renal disease. 2. Gallbladder sludge.   Electronically Signed   By: Kerby Moors M.D.   On: 10/05/2014 14:40     CBC  Recent Labs Lab 10/03/14 1843 10/04/14 0620 10/05/14 0239 10/06/14 0233  WBC 22.4* 12.0* 9.4 7.1  HGB  16.6 14.1 14.1 12.6*  HCT 48.6 40.7 40.5 37.1*  PLT 179 122* 135* 123*  MCV 83.6 82.1 80.2 82.3  MCH 28.6 28.4 27.9 27.9  MCHC 34.2 34.6 34.8 34.0  RDW 14.1 14.2 13.9 13.9  LYMPHSABS 1.1  --   --   --   MONOABS 1.2*  --   --   --   EOSABS 0.0  --   --   --  BASOSABS 0.0  --   --   --     Chemistries   Recent Labs Lab 10/03/14 1843 10/04/14 0620 10/05/14 0239 10/06/14 0233 10/07/14 0835  NA 132* 135 137 135 140  K 5.1 3.7 3.4* 3.8 4.5  CL 98* 103 99* 101 112*  CO2 20* $Remov'22 29 28 25  'GbtGVL$ GLUCOSE 110* 118* 123* 98 96  BUN 41* 45* 40* 28* 18  CREATININE 3.00* 3.33* 3.45* 3.00* 2.34*  CALCIUM 7.9* 7.6* 7.9* 8.0* 8.9  MG  --   --   --  2.1  --   AST 2696*  --  1048* 715* 351*  ALT 2312*  --  1282* 1019* 671*  ALKPHOS 107  --  80 71 61  BILITOT 0.9  --  1.2 1.2 1.2   ------------------------------------------------------------------------------------------------------------------ estimated creatinine clearance is 51 mL/min (by C-G formula based on Cr of 2.34). ------------------------------------------------------------------------------------------------------------------ No results for input(s): HGBA1C in the last 72 hours. ------------------------------------------------------------------------------------------------------------------ No results for input(s): CHOL, HDL, LDLCALC, TRIG, CHOLHDL, LDLDIRECT in the last 72 hours. ------------------------------------------------------------------------------------------------------------------ No results for input(s): TSH, T4TOTAL, T3FREE, THYROIDAB in the last 72 hours.  Invalid input(s): FREET3 ------------------------------------------------------------------------------------------------------------------ No results for input(s): VITAMINB12, FOLATE, FERRITIN, TIBC, IRON, RETICCTPCT in the last 72 hours.  Coagulation profile No results for input(s): INR, PROTIME in the last 168 hours.  No results for input(s): DDIMER in the  last 72 hours.  Cardiac Enzymes No results for input(s): CKMB, TROPONINI, MYOGLOBIN in the last 168 hours.  Invalid input(s): CK ------------------------------------------------------------------------------------------------------------------ Invalid input(s): POCBNP     Time Spent in minutes   30 minutes   Florencia Zaccaro M.D on 10/07/2014 at 1:30 PM  Between 7am to 7pm - Pager - [phone]  After 7pm go to [email or web address] - password [facility]  [facility]   Office  [phone]

## 2014-10-07 NOTE — Progress Notes (Signed)
Physical Therapy Treatment Patient Details Name: Charles Ortega MRN: [id number] DOB: [birth year] Today's Date: 10/07/2014    History of Present Illness Charles Ortega Alligood is a 29 y.o. male who presented to the ED after he awakened in the park after shooting up on Heroin and using cocaine 24 hours earlier, He reports having increased pain all over and weaknessin both legs, pt with rhabdomyolysis    PT Comments    Pt will benefit from continued PT, he would like to be able to walk without the walker and is less reliant on it today; he is motivated and working on his exercises I'ly per his report;  Follow Up Recommendations  No PT follow up (should be able to return to shelter at mod I)     Equipment Recommendations  Rolling walker with 5" wheels (if still needed at D/C)    Recommendations for Other Services       Precautions / Restrictions Precautions Precautions: Fall    Mobility  Bed Mobility Overal bed mobility: Independent             General bed mobility comments: bed flat, no rails  Transfers Overall transfer level: Needs assistance Equipment used: Rolling walker (2 wheeled) Transfers: Sit to/from Stand Sit to Stand: Min guard;Supervision         General transfer comment: inital cues for hand placement, pt demo's good carryover  and self  corrects end of session  Ambulation/Gait Ambulation/Gait assistance: Min guard Ambulation Distance (Feet): 250 Feet (15' more) Assistive device: Rolling walker (2 wheeled) Gait Pattern/deviations: Step-through pattern;Decreased stride length     General Gait Details: pt able to be less reliant on UEs during gait todauy; LOB x 1 with sudden bil knee buckling however pt actually recovered independently and quite rapidly   [employer] Mobility    Modified Rankin (Stroke Patients Only)       Balance                                    Cognition Arousal/Alertness:  Awake/alert Behavior During Therapy: WFL for tasks assessed/performed Overall Cognitive Status: Within Functional Limits for tasks assessed                      Exercises General Exercises - Lower Extremity Long Arc QuadBarbaraann Boys;Both;15 reps Hip Flexion/Marching: AROM;Seated;Both;10 reps Toe Raises: AROM;Seated;Both;20 reps Heel Raises: AROM;Seated;Both;20 reps Other Exercises Other Exercises: also educated pt on doing glut sets and quad sets in the bed since he cannot do LAQs on his own and he is only allowed EOB with staff    General Comments        Pertinent Vitals/Pain Pain Assessment: Faces Pain Score: 3  Pain Location: hips Pain Descriptors / Indicators: Sore Pain Intervention(s): Limited activity within patient's tolerance;Monitored during session    Home Living                      Prior Function            PT Goals (current goals can now be found in the care plan section) Acute Rehab PT Goals Patient Stated Goal: to be able to walk without the walker PT Goal Formulation: With patient Time For Goal Achievement: 10/12/14 Potential to Achieve Goals: Good Progress towards PT goals: Progressing toward goals    Frequency  Min 5X/week    PT Plan Current plan remains appropriate    Co-evaluation             End of Session   Activity Tolerance: Patient tolerated treatment well Patient left: in chair;with call bell/phone within reach     Time: 1210-1234 PT Time Calculation (min) (ACUTE ONLY): 24 min  Charges:  $Gait Training: 23-37 mins                    G Codes:      Gwenna Fuston Nov 02, 2014, 1:22 PM

## 2014-10-08 LAB — COMPREHENSIVE METABOLIC PANEL
ALBUMIN: 3 g/dL — AB (ref 3.5–5.0)
ALK PHOS: 65 U/L (ref 38–126)
ALT: 488 U/L — ABNORMAL HIGH (ref 17–63)
ANION GAP: 7 (ref 5–15)
AST: 206 U/L — ABNORMAL HIGH (ref 15–41)
BILIRUBIN TOTAL: 1.1 mg/dL (ref 0.3–1.2)
BUN: 18 mg/dL (ref 6–20)
CALCIUM: 9 mg/dL (ref 8.9–10.3)
CO2: 24 mmol/L (ref 22–32)
Chloride: 109 mmol/L (ref 101–111)
Creatinine, Ser: 1.87 mg/dL — ABNORMAL HIGH (ref 0.61–1.24)
GFR calc non Af Amer: 48 mL/min — ABNORMAL LOW (ref >60)
GFR, EST AFRICAN AMERICAN: 56 mL/min — AB (ref >60)
GLUCOSE: 96 mg/dL (ref 65–99)
POTASSIUM: 4.4 mmol/L (ref 3.5–5.1)
Sodium: 140 mmol/L (ref 135–145)
TOTAL PROTEIN: 5.7 g/dL — AB (ref 6.5–8.1)

## 2014-10-08 LAB — HEPATITIS PANEL, ACUTE
HEP A IGM: NEGATIVE
HEP B C IGM: NEGATIVE
Hepatitis B Surface Ag: NEGATIVE

## 2014-10-08 LAB — CK: Total CK: 4740 U/L — ABNORMAL HIGH (ref 49–397)

## 2014-10-08 LAB — ANTINUCLEAR ANTIBODIES, IFA: ANA Ab, IFA: NEGATIVE

## 2014-10-08 MED ORDER — OXYCODONE HCL 5 MG PO TABS: 5.0000 mg | ORAL_TABLET | ORAL | Status: DC | PRN

## 2014-10-08 NOTE — Progress Notes (Signed)
[facility] Pt. Is alert and oriented. IV taken out. Pain medication given 1 hour ago- reassessed. Pt is hemodynamically stable. AVS reviewed with pt. Capable of re verbalizing medication regimen. Discharge plan appropriate and in place.

## 2014-10-08 NOTE — Discharge Summary (Signed)
Charles Ortega, is a 29 y.o. male  DOB Sep 22, 1985  MRN [id number].  Admission date:  10/03/2014  Admitting Physician  Theressa Millard, MD  Discharge Date:  10/08/2014   Primary MD  No primary care provider on file.  Recommendations for primary care physician for things to follow:  - Please check BMP, LFT, total CK during next visit, and she'll continue improvement of patient's renal failure, and elevated LFTs.   Admission Diagnosis  Liver failure [K72.90] Renal failure [N19] Weakness [R53.1] MOSF (multiple organ systems failure) [R69] Non-traumatic rhabdomyolysis [M62.82]   Discharge Diagnosis  Liver failure [K72.90] Renal failure [N19] Weakness [R53.1] MOSF (multiple organ systems failure) [R69] Non-traumatic rhabdomyolysis [M62.82]    Principal Problem:   Rhabdomyolysis Active Problems:   AKI (acute kidney injury)   Elevated transaminase level   Polysubstance abuse   Leukocytosis   Acute encephalopathy   SIRS (systemic inflammatory response syndrome)   Shock liver   Acute renal failure syndrome   Hypokalemia   Mononeuropathy   Hyponatremia   Transaminitis      No past medical history on file.  No past surgical history on file.     History of present illness and  Hospital Course:     Kindly see H&P for history of present illness and admission details, please review complete Labs, Consult reports and Test reports for all details in brief  HPI  from the history and physical done on the day of admission  Charles Ortega is a 29 y.o. male who presented to the ED [place] after [place] up on Heroin and using cocaine 24 hours earlier. He reports havign increased pain all over and weakness In both legs. He was evaluated in the ED and found to have a CPK of > 50, 000. He was also found to have elevated LFTs and an increased BUN/Cr. He was unable to  move his legs so an MRI of the T-Spine and Lspine were performed, and Neurology was consulted and he was evaluated by Dr. Jim Like. His symptoms began to improve after hydration was initiated.  Hospital Course   Acute encephalopathy - likely from OD on heroin and cocaine - Resolved, mental status back to baseline  SIRS  - pt met criteria for SIRS with HR 118, RR 30, WBC 22K, lactic acid > 3 - No clear source of infection - given IVDA pt is at high risk for staph, MRSA infections, blood cultures negative to date. - No evidence of infection therefore antibiotics were stopped.  Rhabdomyolysis - from prolonged immobility and drug use - CK > 50,000 for 3 days on a row, currently trending down (4740 on discharge) - Treated with aggressive IV fluid resuscitation 200 mLhr normal saline during hospital stay  Acute kidney injury - secondary to rhabdo  - significantly improved with IV fluids, creatinine peaked at 3.45, and is 1.87 on discharge, agent is encouraged to increase his fluid intake.  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   Why:  appointment , Friday October 16, 2014 at 10:45 am.   Contact information:   [address] [place] [address] [phone]        Discharge Instructions  and  Discharge Medications     Discharge  Instructions    Discharge instructions    Complete by:  As directed   Follow with Primary MD No primary care provider on file. in 7 days   Get CBC, CMP,checked by Primary MD next visit.    Activity: As tolerated with Full fall precautions use walker/cane & assistance as needed   Disposition Home    Diet: Regular , with feeding assistance and aspiration precautions.  For Heart failure patients - Check your Weight same time everyday, if you gain over 2 pounds, or you develop in leg swelling, experience more shortness of breath or chest pain, call your Primary MD immediately. Follow Cardiac Low Salt Diet and 1.5 lit/day fluid restriction.   On your next visit with your primary care physician please Get Medicines reviewed and adjusted.   Please request your Prim.MD to go over all Hospital Tests and Procedure/Radiological results at the follow up, please get all Hospital records sent to your Prim MD by signing hospital release before you go home.   If you experience worsening of your admission symptoms, develop shortness of breath, life threatening emergency, suicidal or homicidal thoughts you must seek medical attention immediately by calling 911 or calling your MD immediately  if symptoms less severe.  You Must read complete instructions/literature along with all the possible adverse reactions/side effects for all the Medicines you take and that have been prescribed to you. Take any new Medicines after you have completely understood and accpet all the possible adverse reactions/side effects.   Do not drive, operating heavy machinery, perform activities at heights, swimming or participation in water activities or provide baby sitting services if your were admitted for syncope or siezures until you have seen by Primary MD or a Neurologist and advised to do so again.  Do not drive when taking Pain medications.    Do not take more than prescribed Pain, Sleep and Anxiety  Medications  Special Instructions: If you have smoked or chewed Tobacco  in the last 2 yrs please stop smoking, stop any regular Alcohol  and or any Recreational drug use.  Wear Seat belts while driving.   Please note  You were cared for by a hospitalist during your hospital stay. If you have any questions about your discharge medications or the care you received while you were in the hospital after you are discharged, you can call the unit and asked to speak with the hospitalist on call if the hospitalist that took care of you is not available. Once you are discharged, your primary care physician will handle any further medical issues. Please note that NO REFILLS for  any discharge medications will be authorized once you are discharged, as it is imperative that you return to your primary care physician (or establish a relationship with a primary care physician if you do not have one) for your aftercare needs so that they can reassess your need for medications and monitor your lab values.            Medication List    STOP taking these medications        clindamycin 300 MG capsule  Commonly known as:  CLEOCIN      TAKE these medications        oxyCODONE 5 MG immediate release tablet  Commonly known as:  Oxy IR/ROXICODONE  Take 1 tablet (5 mg total) by mouth every 4 (four) hours as needed for moderate pain.          Diet and Activity recommendation: See Discharge Instructions above   Consults obtained -  Neurology   Major procedures and Radiology Reports - PLEASE review detailed and final reports for all details, in brief -      Dg Chest 2 View  10/03/2014   CLINICAL DATA:  Abnormal x-ray. Airspace opacities suggested on MRI thoracic spine. Patient reports no chest pain or complaints. Current smoker.  EXAM: CHEST  2 VIEW  COMPARISON:  06/24/2006  FINDINGS: The heart size and mediastinal contours are within normal limits. Both lungs are clear. The visualized skeletal  structures are unremarkable.  IMPRESSION: No active cardiopulmonary disease. MRI findings may have been related to motion artifact.   Electronically Signed   By: Lucienne Capers M.D.   On: 10/03/2014 22:44   Mr Thoracic Spine W Wo Contrast  10/03/2014   CLINICAL DATA:  Lower extremity numbness, hip and back pain. Difficulty walking, weakness. Found in park, one-day of amnesia.  EXAM: MRI THORACIC AND LUMBAR SPINE WITHOUT AND WITH CONTRAST  TECHNIQUE: Multiplanar and multiecho pulse sequences of the thoracic and lumbar spine were obtained without and with intravenous contrast.  CONTRAST:  29mL MULTIHANCE GADOBENATE DIMEGLUMINE 529 MG/ML IV SOLN  COMPARISON:  None.  FINDINGS: MR THORACIC SPINE FINDINGS  Moderately motion degraded examination.  The thoracic alignment is normal. There are no marrow space abnormalities and there is no fracture or mass lesion. No abnormal osseous or intradiscal enhancement. Patchy airspace opacities RIGHT lung.  The cord has normal signal and morphology. No abnormal cord, leptomeningeal nor epidural enhancement. The disks appear normal and there is no disc protrusion, spondylosis, or spinal stenosis.  MR LUMBAR SPINE FINDINGS  Moderately motion degraded examination. Lumbar vertebral bodies and posterior elements are intact and aligned with maintenance of the lumbar lordosis. Intervertebral disc morphology is maintained. Decreased T2 signal within the L4-5 disc compatible with mild desiccation. Minimal chronic discogenic endplate changes D9-2. No STIR signal abnormality to suggest acute osseous process. No abnormal osseous or intradiscal enhancement.  Conus medullaris terminates at T12-L1 and appears normal in morphology and signal characteristics. No abnormal cord, leptomeningeal or epidural enhancement. Limited by patient positioning and motion though, the there appears to be T2 bright signal within the RIGHT iliopsoas muscles at the level of the pelvis.  Level by level evaluation:   L1-2, L2-3, L3-4: No significant disc bulge, canal stenosis or neural foraminal narrowing.  L4-5: Small central disc protrusion, enhancing annular fissure. Minimal facet arthropathy and ligamentum flavum redundancy without canal stenosis or definite neural foraminal narrowing.  L5-S1: Tiny broad-based LEFT central disc protrusion without canal stenosis or neural foraminal narrowing.  IMPRESSION: MRI  THORACIC SPINE: Motion degraded examination with generally normal MRI of the thoracic spine with and without contrast.  Included view of the chest demonstrates patchy airspace opacities RIGHT lung for which dedicated chest radiograph is recommended.  MRI LUMBAR SPINE: Small L4-5 and L5-S1 disc protrusions, L4-5 annular fissure. No neurocompressive changes.  No acute fracture, malalignment or suspicious enhancement.  Suspected edema/strain of the RIGHT iliopsoas muscles though, this could alternatively represent motion artifact.   Electronically Signed   By: Elon Alas M.D.   On: 10/03/2014 21:56   Mr Lumbar Spine W Wo Contrast  10/03/2014   CLINICAL DATA:  Lower extremity numbness, hip and back pain. Difficulty walking, weakness. Found in park, one-day of amnesia.  EXAM: MRI THORACIC AND LUMBAR SPINE WITHOUT AND WITH CONTRAST  TECHNIQUE: Multiplanar and multiecho pulse sequences of the thoracic and lumbar spine were obtained without and with intravenous contrast.  CONTRAST:  43mL MULTIHANCE GADOBENATE DIMEGLUMINE 529 MG/ML IV SOLN  COMPARISON:  None.  FINDINGS: MR THORACIC SPINE FINDINGS  Moderately motion degraded examination.  The thoracic alignment is normal. There are no marrow space abnormalities and there is no fracture or mass lesion. No abnormal osseous or intradiscal enhancement. Patchy airspace opacities RIGHT lung.  The cord has normal signal and morphology. No abnormal cord, leptomeningeal nor epidural enhancement. The disks appear normal and there is no disc protrusion, spondylosis, or spinal  stenosis.  MR LUMBAR SPINE FINDINGS  Moderately motion degraded examination. Lumbar vertebral bodies and posterior elements are intact and aligned with maintenance of the lumbar lordosis. Intervertebral disc morphology is maintained. Decreased T2 signal within the L4-5 disc compatible with mild desiccation. Minimal chronic discogenic endplate changes V7-4. No STIR signal abnormality to suggest acute osseous process. No abnormal osseous or intradiscal enhancement.  Conus medullaris terminates at T12-L1 and appears normal in morphology and signal characteristics. No abnormal cord, leptomeningeal or epidural enhancement. Limited by patient positioning and motion though, the there appears to be T2 bright signal within the RIGHT iliopsoas muscles at the level of the pelvis.  Level by level evaluation:  L1-2, L2-3, L3-4: No significant disc bulge, canal stenosis or neural foraminal narrowing.  L4-5: Small central disc protrusion, enhancing annular fissure. Minimal facet arthropathy and ligamentum flavum redundancy without canal stenosis or definite neural foraminal narrowing.  L5-S1: Tiny broad-based LEFT central disc protrusion without canal stenosis or neural foraminal narrowing.  IMPRESSION: MRI THORACIC SPINE: Motion degraded examination with generally normal MRI of the thoracic spine with and without contrast.  Included view of the chest demonstrates patchy airspace opacities RIGHT lung for which dedicated chest radiograph is recommended.  MRI LUMBAR SPINE: Small L4-5 and L5-S1 disc protrusions, L4-5 annular fissure. No neurocompressive changes.  No acute fracture, malalignment or suspicious enhancement.  Suspected edema/strain of the RIGHT iliopsoas muscles though, this could alternatively represent motion artifact.   Electronically Signed   By: Elon Alas M.D.   On: 10/03/2014 21:56   US Abdomen Complete  10/05/2014   CLINICAL DATA:  Altered mental status.  Transaminitis  EXAM: ULTRASOUND ABDOMEN COMPLETE   COMPARISON:  None.  FINDINGS: Gallbladder: Sludge identified within the gallbladder. No gallbladder wall thickening or pericholecystic fluid.  Common bile duct: Diameter: 4.0 mm  Liver: No focal lesion identified. The echotexture appears heterogeneous.  IVC: No abnormality visualized.  Pancreas: Visualized portion unremarkable.  Spleen: Size and appearance within normal limits.  Right Kidney: Length: 11.9 cm. Increased parenchymal echogenicity. Echogenicity within normal limits. No mass or hydronephrosis visualized.  Left Kidney:  Length: 12.3 cm. Increased parenchymal echogenicity. Echogenicity within normal limits. No mass or hydronephrosis visualized.  Abdominal aorta: No aneurysm visualized.  Other findings: None.  IMPRESSION: 1. Bilateral echogenic kidneys suggestive of chronic medical renal disease. 2. Gallbladder sludge.   Electronically Signed   By: Kerby Moors M.D.   On: 10/05/2014 14:40    Micro Results     Recent Results (from the past 240 hour(s))  MRSA PCR Screening     Status: None   Collection Time: 10/04/14  1:58 AM  Result Value Ref Range Status   MRSA by PCR NEGATIVE NEGATIVE Final    Comment:        The GeneXpert MRSA Assay (FDA approved for NASAL specimens only), is one component of a comprehensive MRSA colonization surveillance program. It is not intended to diagnose MRSA infection nor to guide or monitor treatment for MRSA infections.   Culture, blood (routine x 2)     Status: None (Preliminary result)   Collection Time: 10/04/14  1:00 PM  Result Value Ref Range Status   Specimen Description BLOOD LEFT ANTECUBITAL  Final   Special Requests BOTTLES DRAWN AEROBIC AND ANAEROBIC 5CC  Final   Culture NO GROWTH 3 DAYS  Final   Report Status PENDING  Incomplete  Culture, blood (routine x 2)     Status: None (Preliminary result)   Collection Time: 10/04/14  1:20 PM  Result Value Ref Range Status   Specimen Description BLOOD BLOOD RIGHT HAND  Final   Special Requests  BOTTLES DRAWN AEROBIC ONLY 1CC  Final   Culture NO GROWTH 3 DAYS  Final   Report Status PENDING  Incomplete  Urine culture     Status: None   Collection Time: 10/04/14  2:32 PM  Result Value Ref Range Status   Specimen Description URINE, RANDOM  Final   Special Requests NONE  Final   Culture NO GROWTH 1 DAY  Final   Report Status 10/05/2014 FINAL  Final       Today   Subjective:   Charles Ortega today has no headache,no chest abdominal pain, reports his weakness significantly improved, feels much better wants to go home today.   Objective:   Blood pressure 129/71, pulse 66, temperature 98.4 F (36.9 C), temperature source Oral, resp. rate 18, height $RemoveBe'5\' 11"'cqUQzSUGE$  (1.803 m), weight 77.565 kg (171 lb), SpO2 100 %.   Intake/Output Summary (Last 24 hours) at 10/08/14 1045 Last data filed at 10/08/14 0846  Gross per 24 hour  Intake   2243 ml  Output   4450 ml  Net  -2207 ml    Exam  Awake Alert, Oriented X 3, No new F.N deficits, Normal affect [place].AT,PERRAL Supple Neck,No JVD, No cervical lymphadenopathy appriciated.  Symmetrical Chest wall movement, Good air movement bilaterally, CTAB RRR,No Gallops,Rubs or new Murmurs, No Parasternal Heave +ve B.Sounds, Abd Soft, No tenderness, No organomegaly appriciated, No rebound - guarding or rigidity. No Cyanosis, Clubbing or edema, No new Rash or bruise  Data Review   CBC w Diff: Lab Results  Component Value Date   WBC 7.1 10/06/2014   HGB 12.6* 10/06/2014   HCT 37.1* 10/06/2014   PLT 123* 10/06/2014   LYMPHOPCT 5* 10/03/2014   MONOPCT 5 10/03/2014   EOSPCT 0 10/03/2014   BASOPCT 0 10/03/2014    CMP: Lab Results  Component Value Date   NA 140 10/08/2014   K 4.4 10/08/2014   CL 109 10/08/2014   CO2 24 10/08/2014   BUN 18  10/08/2014   CREATININE 1.87* 10/08/2014   PROT 5.7* 10/08/2014   ALBUMIN 3.0* 10/08/2014   BILITOT 1.1 10/08/2014   ALKPHOS 65 10/08/2014   AST 206* 10/08/2014   ALT 488* 10/08/2014  .   Total  Time in preparing paper work, data evaluation and todays exam - 35 minutes  Faizah Kandler M.D on 10/08/2014 at 10:45 AM  [facility]   Office  [phone]

## 2014-10-08 NOTE — Care Management Note (Signed)
Case Management Note  Patient Details  Name: D[employer]elle Corp MRN: [id number] Date of Birth: 1986-01-03  Subjective/Objective:        CM following for progression and d/c planning.            Action/Plan: CM following and will provide [facility] letter for meds if needed. Pt is ambulatory, independent and has no HHRN or PT needs.   Expected Discharge Date:       10/09/2014           Expected Discharge Plan:  Home/Self Care  In-House Referral:  Clinical Social Work  Discharge planning Services  CM Consult  Post Acute Care Choice:  NA Choice offered to:  NA  DME Arranged:    DME Agency:     HH Arranged:    HH Agency:     Status of Service:  Completed, signed off  Medicare Important Message Given:    Date Medicare IM Given:    Medicare IM give by:    Date Additional Medicare IM Given:    Additional Medicare Important Message give by:     If discussed at Long Length of Stay Meetings, dates discussed:    Additional Comments:  Starlyn Skeans, RN 10/08/2014, 9:14 AM

## 2014-10-08 NOTE — Discharge Instructions (Signed)
Follow with Primary MD  in 7 days   Get CBC, CMP,  by Primary MD next visit.    Activity: As tolerated with Full fall precautions use walker/cane & assistance as needed   Disposition Home   Diet: low salt , with feeding assistance and aspiration precautions.  For Heart failure patients - Check your Weight same time everyday, if you gain over 2 pounds, or you develop in leg swelling, experience more shortness of breath or chest pain, call your Primary MD immediately. Follow Cardiac Low Salt Diet and 1.5 lit/day fluid restriction.   On your next visit with your primary care physician please Get Medicines reviewed and adjusted.   Please request your Prim.MD to go over all Hospital Tests and Procedure/Radiological results at the follow up, please get all Hospital records sent to your Prim MD by signing hospital release before you go home.   If you experience worsening of your admission symptoms, develop shortness of breath, life threatening emergency, suicidal or homicidal thoughts you must seek medical attention immediately by calling 911 or calling your MD immediately  if symptoms less severe.  You Must read complete instructions/literature along with all the possible adverse reactions/side effects for all the Medicines you take and that have been prescribed to you. Take any new Medicines after you have completely understood and accpet all the possible adverse reactions/side effects.   Do not drive, operating heavy machinery, perform activities at heights, swimming or participation in water activities or provide baby sitting services if your were admitted for syncope or siezures until you have seen by Primary MD or a Neurologist and advised to do so again.  Do not drive when taking Pain medications.    Do not take more than prescribed Pain, Sleep and Anxiety Medications  Special Instructions: If you have smoked or chewed Tobacco  in the last 2 yrs please stop smoking, stop any regular  Alcohol  and or any Recreational drug use.  Wear Seat belts while driving.   Please note  You were cared for by a hospitalist during your hospital stay. If you have any questions about your discharge medications or the care you received while you were in the hospital after you are discharged, you can call the unit and asked to speak with the hospitalist on call if the hospitalist that took care of you is not available. Once you are discharged, your primary care physician will handle any further medical issues. Please note that NO REFILLS for any discharge medications will be authorized once you are discharged, as it is imperative that you return to your primary care physician (or establish a relationship with a primary care physician if you do not have one) for your aftercare needs so that they can reassess your need for medications and monitor your lab values.

## 2014-10-08 NOTE — Clinical Social Work Note (Signed)
Patient medically stable for discharge today per MD. Patient requested bus to get to [facility] where he is currently living. Bus ticket given to patient's nurse. CSW signing off, however please reconsult if any other social work services needed.   Genelle Bal, MSW, LCSW Licensed Clinical Social Worker Clinical Social Work Department [employer] [phone]

## 2014-10-09 LAB — CULTURE, BLOOD (ROUTINE X 2)
CULTURE: NO GROWTH
Culture: NO GROWTH

## 2014-10-16 ENCOUNTER — Inpatient Hospital Stay: Payer: Self-pay | Admitting: Family Medicine

## 2014-10-31 ENCOUNTER — Emergency Department (HOSPITAL_COMMUNITY)
Admission: EM | Admit: 2014-10-31 | Discharge: 2014-10-31 | Disposition: A | Discharge status: Home / Self Care | Payer: Self-pay | Attending: Emergency Medicine | Admitting: Emergency Medicine

## 2014-10-31 ENCOUNTER — Encounter (HOSPITAL_COMMUNITY): Payer: Self-pay | Admitting: Emergency Medicine

## 2014-10-31 DIAGNOSIS — M7989 Other specified soft tissue disorders: Secondary | ICD-10-CM | POA: Insufficient documentation

## 2014-10-31 DIAGNOSIS — M62838 Other muscle spasm: Secondary | ICD-10-CM | POA: Insufficient documentation

## 2014-10-31 DIAGNOSIS — M79606 Pain in leg, unspecified: Secondary | ICD-10-CM

## 2014-10-31 DIAGNOSIS — M79605 Pain in left leg: Secondary | ICD-10-CM | POA: Insufficient documentation

## 2014-10-31 DIAGNOSIS — Z720 Tobacco use: Secondary | ICD-10-CM | POA: Insufficient documentation

## 2014-10-31 DIAGNOSIS — M79604 Pain in right leg: Secondary | ICD-10-CM | POA: Insufficient documentation

## 2014-10-31 DIAGNOSIS — R29898 Other symptoms and signs involving the musculoskeletal system: Secondary | ICD-10-CM

## 2014-10-31 DIAGNOSIS — M6281 Muscle weakness (generalized): Secondary | ICD-10-CM | POA: Insufficient documentation

## 2014-10-31 HISTORY — DX: Other psychoactive substance abuse, uncomplicated: F19.10

## 2014-10-31 LAB — BASIC METABOLIC PANEL
Anion gap: 8 (ref 5–15)
BUN: 10 mg/dL (ref 6–20)
CALCIUM: 9.7 mg/dL (ref 8.9–10.3)
CHLORIDE: 100 mmol/L — AB (ref 101–111)
CO2: 30 mmol/L (ref 22–32)
CREATININE: 0.94 mg/dL (ref 0.61–1.24)
GFR calc non Af Amer: >60 mL/min (ref >60)
GLUCOSE: 105 mg/dL — AB (ref 65–99)
Potassium: 4.2 mmol/L (ref 3.5–5.1)
Sodium: 138 mmol/L (ref 135–145)

## 2014-10-31 LAB — CBC WITH DIFFERENTIAL/PLATELET
BASOS PCT: 0 %
Basophils Absolute: 0 10*3/uL (ref 0.0–0.1)
Eosinophils Absolute: 0.1 10*3/uL (ref 0.0–0.7)
Eosinophils Relative: 1 %
HEMATOCRIT: 43.4 % (ref 39.0–52.0)
HEMOGLOBIN: 15 g/dL (ref 13.0–17.0)
LYMPHS ABS: 1.4 10*3/uL (ref 0.7–4.0)
Lymphocytes Relative: 14 %
MCH: 28.6 pg (ref 26.0–34.0)
MCHC: 34.6 g/dL (ref 30.0–36.0)
MCV: 82.8 fL (ref 78.0–100.0)
MONO ABS: 0.8 10*3/uL (ref 0.1–1.0)
MONOS PCT: 8 %
NEUTROS ABS: 8.2 10*3/uL — AB (ref 1.7–7.7)
Neutrophils Relative %: 77 %
Platelets: 252 10*3/uL (ref 150–400)
RBC: 5.24 MIL/uL (ref 4.22–5.81)
RDW: 13.6 % (ref 11.5–15.5)
WBC: 10.5 10*3/uL (ref 4.0–10.5)

## 2014-10-31 LAB — CK: Total CK: 161 U/L (ref 49–397)

## 2014-10-31 MED ORDER — OXYCODONE-ACETAMINOPHEN 5-325 MG PO TABS
2.0000 | ORAL_TABLET | Freq: Once | ORAL | Status: AC
  Administered 2014-10-31: 2 via ORAL
  Filled 2014-10-31: qty 2

## 2014-10-31 NOTE — ED Provider Notes (Signed)
CSN: [id number]     Arrival date & time 10/31/14  1211 History  This chart was scribed for Joycie Peek, PA-C, working with Alvira Monday, MD by Chestine Spore, ED Scribe. The patient was seen in room WTR7/WTR7 at 12:59 PM.    Chief Complaint  Patient presents with  . Leg Pain    recurrent leg spasms      The history is provided by the patient. No language interpreter was used.    Charles Ortega is a 29 y.o. male with a medical hx of substance abuse of heroin and cocaine who presents to the Emergency Department via EMS complaining of bilateral recurrent leg spasms left worse than right onset 3 weeks. He states that he has nerve damage to his legs due to his legs atrophy due to the pain. He notes that he has had the bilateral leg pain since he overdosed on heroin 3 weeks ago where he was hospitalized for 6 days. He was then discharged with 5 mg of oxycodone which he ran out of 10 days ago. He reports that he has not been to see any specialist after the hospitalization. He states that he lives at [facility]. He states that he has not used heroin since his overdose. He reports that his pain is in the front of both knees and the front of his lower thighs. Pt uses a walker to walk due to his pain/nerve damage. Pt is having associated symptoms of intermittent lower thigh swelling with walking. He denies color change, wound, rash, CP, SOB, and any other symptoms.    Past Medical History  Diagnosis Date  . Substance abuse     heroin, cocaine   History reviewed. No pertinent past surgical history. History reviewed. No pertinent family history. Social History  Substance Use Topics  . Smoking status: Current Every Day Smoker  . Smokeless tobacco: None  . Alcohol Use: 0.6 oz/week    1 Cans of beer per week    Review of Systems  Respiratory: Negative for shortness of breath.   Cardiovascular: Positive for leg swelling (intermittent lower thigh ). Negative for chest pain.   Musculoskeletal: Positive for arthralgias (bilateral left worse than right).  Skin: Negative for color change, rash and wound.      Allergies  Review of patient's allergies indicates no known allergies.  Home Medications   Prior to Admission medications   Medication Sig Start Date End Date Taking? Authorizing Provider  oxyCODONE (OXY IR/ROXICODONE) 5 MG immediate release tablet Take 1 tablet (5 mg total) by mouth every 4 (four) hours as needed for moderate pain. 10/08/14   Leana Roe Elgergawy, MD   BP 151/99 mmHg  Pulse 96  Temp(Src) 98.9 F (37.2 C) (Oral)  Resp 18  Wt 170 lb (77.111 kg)  SpO2 100% Physical Exam  Constitutional: He is oriented to person, place, and time. He appears well-developed and well-nourished. No distress.  HENT:  Head: Normocephalic and atraumatic.  Eyes: EOM are normal.  Neck: Neck supple.  Cardiovascular: Normal rate, regular rhythm and normal heart sounds.  Exam reveals no gallop and no friction rub.   No murmur heard. Pulmonary/Chest: Effort normal and breath sounds normal. No respiratory distress. He has no wheezes. He has no rales.  Abdominal: Soft. There is no tenderness.  Musculoskeletal: Normal range of motion.  Diffuse tenderness throughout quadriceps, knees, and proximal anterior shins. Muscle compartments are soft with no obvious discoloration, swelling, or warmth.  Neurological: He is alert and oriented  to person, place, and time.  Decreased strength in bilateral quadriceps. Maintains hamstring strength, hip flexors are 5/5 motor strength. Sensation intact to light touch. Diminished patellar reflexes bilaterally.  Skin: Skin is warm and dry.  Psychiatric: He has a normal mood and affect. His behavior is normal.  Nursing note and vitals reviewed.   ED Course  Procedures (including critical care time) DIAGNOSTIC STUDIES: Oxygen Saturation is 95% on RA, adequate by my interpretation.    COORDINATION OF CARE: 1:05 PM Discussed treatment  plan with pt at bedside and pt agreed to plan.    Labs Review Labs Reviewed  CBC WITH DIFFERENTIAL/PLATELET - Abnormal; Notable for the following:    Neutro Abs 8.2 (*)    All other components within normal limits  BASIC METABOLIC PANEL - Abnormal; Notable for the following:    Chloride 100 (*)    Glucose, Bld 105 (*)    All other components within normal limits  CK    Imaging Review No results found. I have personally reviewed and evaluated these images and lab results as part of my medical decision-making.   EKG Interpretation None     Meds given in ED:  Medications  oxyCODONE-acetaminophen (PERCOCET/ROXICET) 5-325 MG per tablet 2 tablet (2 tablets Oral Given 10/31/14 1547)    Discharge Medication List as of 10/31/2014  3:51 PM     Filed Vitals:   10/31/14 1241 10/31/14 1605  BP: 122/81 151/99  Pulse: 111 96  Temp: 98.9 F (37.2 C)   TempSrc: Oral   Resp: 18 18  Weight: 170 lb (77.111 kg)   SpO2: 95% 100%    MDM  Vitals stable - WNL -afebrile Pt resting comfortably in ED. PE--physical exam as above. Patient with quadricep muscle weakness and diminished patellar reflexes. Exam is similar to previous exam with no new objective findings. Distal pulses are intact, muscle compartments are soft Patient reports his pain and weakness is unchanged over the past 3 weeks and denies any new medical problems. He has not followed up with any other providers for these problems. Repeat CK is 161, labs are otherwise noncontributory. No evidence of rhabdomyolysis, AKI. Patient given outpatient referral to [facility] neurology for further evaluation of his weakness and diffuse leg pain. No evidence of other acute or emergent pathology at this time. I discussed all relevant lab findings and imaging results with pt and they verbalized understanding. Discussed f/u with PCP within 48 hrs and return precautions, pt very amenable to plan.  Final diagnoses:  Pain of lower extremity,  unspecified laterality  Leg weakness, bilateral   I personally performed the services described in this documentation, which was scribed in my presence. The recorded information has been reviewed and is accurate.    Joycie Peek, PA-C 10/31/14 1723  Alvira Monday, MD 11/03/14 [phone]

## 2014-10-31 NOTE — ED Notes (Signed)
Per EMS # 12 -Pt called for transport to ED with c/o recurrent bilateral leg spasms. Pt stated that he has had spasms in both legs since heroin overdose 3 weeks ago. Pt stated that he ran out pain meds 10 days ago, Pt stated he has not used heroin since overdose. Pt is alert, oriented and ambulatory with walker

## 2014-10-31 NOTE — ED Notes (Signed)
Bed: WTR7 Expected date:  Expected time:  Means of arrival:  Comments: EMS- leg pain x3 weeks

## 2014-10-31 NOTE — Discharge Instructions (Signed)
You were evaluated in the ED today for your leg pain or leg weakness. There does not appear to be an emergent cause her symptoms at this time. You will need to follow up with [facility] neurology for further evaluation of your pain and weakness. Labs began drawn today were very reassuring. Return to ED for new or worsening symptoms.  Pain of Unknown Etiology (Pain Without a Known Cause) You have come to your caregiver because of pain. Pain can occur in any part of the body. Often there is not a definite cause. If your laboratory (blood or urine) work was normal and X-rays or other studies were normal, your caregiver may treat you without knowing the cause of the pain. An example of this is the headache. Most headaches are diagnosed by taking a history. This means your caregiver asks you questions about your headaches. Your caregiver determines a treatment based on your answers. Usually testing done for headaches is normal. Often testing is not done unless there is no response to medications. Regardless of where your pain is located today, you can be given medications to make you comfortable. If no physical cause of pain can be found, most cases of pain will gradually leave as suddenly as they came.  If you have a painful condition and no reason can be found for the pain, it is important that you follow up with your caregiver. If the pain becomes worse or does not go away, it may be necessary to repeat tests and look further for a possible cause.  Only take over-the-counter or prescription medicines for pain, discomfort, or fever as directed by your caregiver.  For the protection of your privacy, test results cannot be given over the phone. Make sure you receive the results of your test. Ask how these results are to be obtained if you have not been informed. It is your responsibility to obtain your test results.  You may continue all activities unless the activities cause more pain. When the pain lessens, it  is important to gradually resume normal activities. Resume activities by beginning slowly and gradually increasing the intensity and duration of the activities or exercise. During periods of severe pain, bed rest may be helpful. Lie or sit in any position that is comfortable.  Ice used for acute (sudden) conditions may be effective. Use a large plastic bag filled with ice and wrapped in a towel. This may provide pain relief.  See your caregiver for continued problems. Your caregiver can help or refer you for exercises or physical therapy if necessary. If you were given medications for your condition, do not drive, operate machinery or power tools, or sign legal documents for 24 hours. Do not drink alcohol, take sleeping pills, or take other medications that may interfere with treatment. See your caregiver immediately if you have pain that is becoming worse and not relieved by medications. Document Released: 10/18/2000 Document Revised: 11/13/2012 Document Reviewed: 01/23/2005 [facility] Patient Information 2015 [place], [place]. This information is not intended to replace advice given to you by your health care provider. Make sure you discuss any questions you have with your health care provider.  Neuropathic Pain We often think that pain has a physical cause. If we get rid of the cause, the pain should go away. Nerves themselves can also cause pain. It is called neuropathic pain, which means nerve abnormality. It may be difficult for the patients who have it and for the treating caregivers. Pain is usually described as acute (short-lived) or  chronic (long-lasting). Acute pain is related to the physical sensations caused by an injury. It can last from a few seconds to many weeks, but it usually goes away when normal healing occurs. Chronic pain lasts beyond the typical healing time. With neuropathic pain, the nerve fibers themselves may be damaged or injured. They then send incorrect signals to other pain  centers. The pain you feel is real, but the cause is not easy to find.  CAUSES  Chronic pain can result from diseases, such as diabetes and shingles (an infection related to chickenpox), or from trauma, surgery, or amputation. It can also happen without any known injury or disease. The nerves are sending pain messages, even though there is no identifiable cause for such messages.   Other common causes of neuropathy include diabetes, phantom limb pain, or Regional Pain Syndrome (RPS).  As with all forms of chronic back pain, if neuropathy is not correctly treated, there can be a number of associated problems that lead to a downward cycle for the patient. These include depression, sleeplessness, feelings of fear and anxiety, limited social interaction and inability to do normal daily activities or work.  The most dramatic and mysterious example of neuropathic pain is called "phantom limb syndrome." This occurs when an arm or a leg has been removed because of illness or injury. The brain still gets pain messages from the nerves that originally carried impulses from the missing limb. These nerves now seem to misfire and cause troubling pain.  Neuropathic pain often seems to have no cause. It responds poorly to standard pain treatment. Neuropathic pain can occur after:  Shingles (herpes zoster virus infection).  A lasting burning sensation of the skin, caused usually by injury to a peripheral nerve.  Peripheral neuropathy which is widespread nerve damage, often caused by diabetes or alcoholism.  Phantom limb pain following an amputation.  Facial nerve problems (trigeminal neuralgia).  Multiple sclerosis.  Reflex sympathetic dystrophy.  Pain which comes with cancer and cancer chemotherapy.  Entrapment neuropathy such as when pressure is put on a nerve such as in carpal tunnel syndrome.  Back, leg, and hip problems (sciatica).  Spine or back surgery.  HIV Infection or AIDS where nerves  are infected by viruses. Your caregiver can explain items in the above list which may apply to you. SYMPTOMS  Characteristics of neuropathic pain are:  Severe, sharp, electric shock-like, shooting, lightening-like, knife-like.  Pins and needles sensation.  Deep burning, deep cold, or deep ache.  Persistent numbness, tingling, or weakness.  Pain resulting from light touch or other stimulus that would not usually cause pain.  Increased sensitivity to something that would normally cause pain, such as a pinprick. Pain may persist for months or years following the healing of damaged tissues. When this happens, pain signals no longer sound an alarm about current injuries or injuries about to happen. Instead, the alarm system itself is not working correctly.  Neuropathic pain may get worse instead of better over time. For some people, it can lead to serious disability. It is important to be aware that severe injury in a limb can occur without a proper, protective pain response.Burns, cuts, and other injuries may go unnoticed. Without proper treatment, these injuries can become infected or lead to further disability. Take any injury seriously, and consult your caregiver for treatment. DIAGNOSIS  When you have a pain with no known cause, your caregiver will probably ask some specific questions:   Do you have any other conditions, such as  diabetes, shingles, multiple sclerosis, or HIV infection?  How would you describe your pain? (Neuropathic pain is often described as shooting, stabbing, burning, or searing.)  Is your pain worse at any time of the day? (Neuropathic pain is usually worse at night.)  Does the pain seem to follow a certain physical pathway?  Does the pain come from an area that has missing or injured nerves? (An example would be phantom limb pain.)  Is the pain triggered by minor things such as rubbing against the sheets at night? These questions often help define the type of  pain involved. Once your caregiver knows what is happening, treatment can begin. Anticonvulsant, antidepressant drugs, and various pain relievers seem to work in some cases. If another condition, such as diabetes is involved, better management of that disorder may relieve the neuropathic pain.  TREATMENT  Neuropathic pain is frequently long-lasting and tends not to respond to treatment with narcotic type pain medication. It may respond well to other drugs such as antiseizure and antidepressant medications. Usually, neuropathic problems do not completely go away, but partial improvement is often possible with proper treatment. Your caregivers have large numbers of medications available to treat you. Do not be discouraged if you do not get immediate relief. Sometimes different medications or a combination of medications will be tried before you receive the results you are hoping for. See your caregiver if you have pain that seems to be coming from nowhere and does not go away. Help is available.  SEEK IMMEDIATE MEDICAL CARE IF:   There is a sudden change in the quality of your pain, especially if the change is on only one side of the body.  You notice changes of the skin, such as redness, black or purple discoloration, swelling, or an ulcer.  You cannot move the affected limbs. Document Released: 10/21/2003 Document Revised: 04/17/2011 Document Reviewed: 10/21/2003 [facility] Patient Information 2015 [place], [place]. This information is not intended to replace advice given to you by your health care provider. Make sure you discuss any questions you have with your health care provider.

## 2014-10-31 NOTE — ED Notes (Signed)
Pt reports that the spasms started 3 weeks ago. Pt stated that he overdosed on heroin. Hospitalized for 6 days. He feels that he may have injured his leg muscles and suffered nerve damage due to episode. Discharged with  of Oxycodone. Took last one last week. Pt is alert and cooperative. Stated that he lives at [facility]

## 2014-11-05 DIAGNOSIS — M6282 Rhabdomyolysis: Secondary | ICD-10-CM

## 2014-11-05 NOTE — Patient Instructions (Signed)
Meet with Warden/ranger on Wednesday to begin orange card application and acquire substance abuse treatment

## 2014-11-05 NOTE — Congregational Nurse Program (Signed)
Client requested blood pressure check.  Discussed recent hospital stay and current need to f/u with drug abuse treatment.  Referred to social work interns to assist with applying for orange card and treatment at [facility] [place].

## 2014-12-09 ENCOUNTER — Ambulatory Visit: Payer: Self-pay | Admitting: Neurology

## 2015-01-08 ENCOUNTER — Ambulatory Visit: Payer: Self-pay | Admitting: Neurology

## 2015-03-01 ENCOUNTER — Ambulatory Visit: Payer: Self-pay | Admitting: Neurology

## 2015-03-17 ENCOUNTER — Encounter (HOSPITAL_COMMUNITY): Payer: Self-pay | Admitting: Family Medicine

## 2015-03-17 ENCOUNTER — Encounter (HOSPITAL_COMMUNITY): Payer: Self-pay | Admitting: *Deleted

## 2015-03-17 ENCOUNTER — Observation Stay (HOSPITAL_COMMUNITY)
Admission: AD | Admit: 2015-03-17 | Discharge: 2015-03-18 | Disposition: A | Discharge status: Home / Self Care | Payer: Federal, State, Local not specified - Other | Source: Intra-hospital | Attending: Psychiatry | Admitting: Psychiatry

## 2015-03-17 ENCOUNTER — Emergency Department (HOSPITAL_COMMUNITY)
Admission: EM | Admit: 2015-03-17 | Discharge: 2015-03-17 | Disposition: A | Discharge status: Other Acute Inpatient Hospital | Payer: Self-pay | Attending: Emergency Medicine | Admitting: Emergency Medicine

## 2015-03-17 DIAGNOSIS — F1210 Cannabis abuse, uncomplicated: Secondary | ICD-10-CM | POA: Insufficient documentation

## 2015-03-17 DIAGNOSIS — F1410 Cocaine abuse, uncomplicated: Secondary | ICD-10-CM | POA: Insufficient documentation

## 2015-03-17 DIAGNOSIS — Z590 Homelessness: Secondary | ICD-10-CM | POA: Insufficient documentation

## 2015-03-17 DIAGNOSIS — F332 Major depressive disorder, recurrent severe without psychotic features: Secondary | ICD-10-CM | POA: Diagnosis not present

## 2015-03-17 DIAGNOSIS — F1124 Opioid dependence with opioid-induced mood disorder: Principal | ICD-10-CM | POA: Insufficient documentation

## 2015-03-17 DIAGNOSIS — F17210 Nicotine dependence, cigarettes, uncomplicated: Secondary | ICD-10-CM | POA: Insufficient documentation

## 2015-03-17 DIAGNOSIS — F1910 Other psychoactive substance abuse, uncomplicated: Secondary | ICD-10-CM | POA: Diagnosis present

## 2015-03-17 DIAGNOSIS — F1990 Other psychoactive substance use, unspecified, uncomplicated: Secondary | ICD-10-CM | POA: Insufficient documentation

## 2015-03-17 DIAGNOSIS — R45851 Suicidal ideations: Secondary | ICD-10-CM

## 2015-03-17 DIAGNOSIS — Z79899 Other long term (current) drug therapy: Secondary | ICD-10-CM | POA: Insufficient documentation

## 2015-03-17 DIAGNOSIS — F1110 Opioid abuse, uncomplicated: Secondary | ICD-10-CM | POA: Insufficient documentation

## 2015-03-17 DIAGNOSIS — M791 Myalgia: Secondary | ICD-10-CM | POA: Insufficient documentation

## 2015-03-17 DIAGNOSIS — F1490 Cocaine use, unspecified, uncomplicated: Secondary | ICD-10-CM | POA: Insufficient documentation

## 2015-03-17 DIAGNOSIS — F1994 Other psychoactive substance use, unspecified with psychoactive substance-induced mood disorder: Secondary | ICD-10-CM | POA: Diagnosis not present

## 2015-03-17 DIAGNOSIS — G9340 Encephalopathy, unspecified: Secondary | ICD-10-CM | POA: Insufficient documentation

## 2015-03-17 LAB — RAPID URINE DRUG SCREEN, HOSP PERFORMED
AMPHETAMINES: NOT DETECTED
BENZODIAZEPINES: NOT DETECTED
Barbiturates: NOT DETECTED
COCAINE: POSITIVE — AB
OPIATES: POSITIVE — AB
TETRAHYDROCANNABINOL: POSITIVE — AB

## 2015-03-17 LAB — COMPREHENSIVE METABOLIC PANEL
ALK PHOS: 68 U/L (ref 38–126)
ALT: 17 U/L (ref 17–63)
ANION GAP: 11 (ref 5–15)
AST: 26 U/L (ref 15–41)
Albumin: 4.4 g/dL (ref 3.5–5.0)
BILIRUBIN TOTAL: 1.1 mg/dL (ref 0.3–1.2)
BUN: 14 mg/dL (ref 6–20)
CALCIUM: 9.6 mg/dL (ref 8.9–10.3)
CO2: 25 mmol/L (ref 22–32)
Chloride: 106 mmol/L (ref 101–111)
Creatinine, Ser: 0.99 mg/dL (ref 0.61–1.24)
Glucose, Bld: 125 mg/dL — ABNORMAL HIGH (ref 65–99)
Potassium: 4.1 mmol/L (ref 3.5–5.1)
Sodium: 142 mmol/L (ref 135–145)
TOTAL PROTEIN: 7 g/dL (ref 6.5–8.1)

## 2015-03-17 LAB — CBC
HEMATOCRIT: 43.4 % (ref 39.0–52.0)
Hemoglobin: 14.6 g/dL (ref 13.0–17.0)
MCH: 28.1 pg (ref 26.0–34.0)
MCHC: 33.6 g/dL (ref 30.0–36.0)
MCV: 83.6 fL (ref 78.0–100.0)
PLATELETS: 214 10*3/uL (ref 150–400)
RBC: 5.19 MIL/uL (ref 4.22–5.81)
RDW: 14 % (ref 11.5–15.5)
WBC: 6.7 10*3/uL (ref 4.0–10.5)

## 2015-03-17 LAB — ETHANOL

## 2015-03-17 LAB — ACETAMINOPHEN LEVEL

## 2015-03-17 LAB — SALICYLATE LEVEL

## 2015-03-17 MED ORDER — NAPROXEN 500 MG PO TABS: 500.0000 mg | ORAL_TABLET | Freq: Two times a day (BID) | ORAL | Status: DC | PRN

## 2015-03-17 MED ORDER — ACETAMINOPHEN 325 MG PO TABS: 650.0000 mg | ORAL_TABLET | Freq: Four times a day (QID) | ORAL | Status: DC | PRN

## 2015-03-17 MED ORDER — HYDROXYZINE HCL 25 MG PO TABS
25.0000 mg | ORAL_TABLET | Freq: Four times a day (QID) | ORAL | Status: DC | PRN
  Administered 2015-03-17 – 2015-03-18 (×2): 25 mg via ORAL
  Filled 2015-03-17 (×2): qty 1

## 2015-03-17 MED ORDER — TRAZODONE HCL 50 MG PO TABS
50.0000 mg | ORAL_TABLET | Freq: Every evening | ORAL | Status: DC | PRN
  Administered 2015-03-17: 50 mg via ORAL
  Filled 2015-03-17: qty 1

## 2015-03-17 MED ORDER — NICOTINE 21 MG/24HR TD PT24
21.0000 mg | MEDICATED_PATCH | Freq: Every day | TRANSDERMAL | Status: DC
Start: 2015-03-17 — End: 2015-03-18
  Administered 2015-03-17 – 2015-03-18 (×2): 21 mg via TRANSDERMAL
  Filled 2015-03-17: qty 1

## 2015-03-17 MED ORDER — ONDANSETRON 4 MG PO TBDP: 4.0000 mg | ORAL_TABLET | Freq: Four times a day (QID) | ORAL | Status: DC | PRN

## 2015-03-17 MED ORDER — METHOCARBAMOL 500 MG PO TABS: 500.0000 mg | ORAL_TABLET | Freq: Three times a day (TID) | ORAL | Status: DC | PRN

## 2015-03-17 MED ORDER — ALUM & MAG HYDROXIDE-SIMETH 200-200-20 MG/5ML PO SUSP
30.0000 mL | ORAL | Status: DC | PRN
Start: 2015-03-17 — End: 2015-03-18

## 2015-03-17 MED ORDER — DICYCLOMINE HCL 20 MG PO TABS: 20.0000 mg | ORAL_TABLET | Freq: Four times a day (QID) | ORAL | Status: DC | PRN

## 2015-03-17 MED ORDER — CLONIDINE HCL 0.1 MG PO TABS: 0.1000 mg | ORAL_TABLET | ORAL | Status: DC

## 2015-03-17 MED ORDER — NICOTINE 21 MG/24HR TD PT24
MEDICATED_PATCH | TRANSDERMAL | Status: AC
  Administered 2015-03-17: 19:00:00
  Filled 2015-03-17: qty 1

## 2015-03-17 MED ORDER — MAGNESIUM HYDROXIDE 400 MG/5ML PO SUSP: 30.0000 mL | Freq: Every day | ORAL | Status: DC | PRN

## 2015-03-17 MED ORDER — LOPERAMIDE HCL 2 MG PO CAPS: 2.0000 mg | ORAL_CAPSULE | ORAL | Status: DC | PRN

## 2015-03-17 MED ORDER — CLONIDINE HCL 0.1 MG PO TABS
0.1000 mg | ORAL_TABLET | Freq: Four times a day (QID) | ORAL | Status: DC
  Administered 2015-03-17 – 2015-03-18 (×3): 0.1 mg via ORAL
  Filled 2015-03-17 (×3): qty 1

## 2015-03-17 MED ORDER — CLONIDINE HCL 0.1 MG PO TABS: 0.1000 mg | ORAL_TABLET | Freq: Every day | ORAL | Status: DC

## 2015-03-17 NOTE — ED Provider Notes (Signed)
CSN: [id number]     Arrival date & time 03/17/15  0930 History   First MD Initiated Contact with Patient 03/17/15 [phone]     Chief Complaint  Patient presents with  . Drug Problem  . Suicidal   (Consider location/radiation/quality/duration/timing/severity/associated sxs/prior Treatment) Patient is a 30 y.o. male presenting with drug problem. The history is provided by the patient. No language interpreter was used.  Drug Problem Associated symptoms include myalgias. Pertinent negatives include no chest pain.   [name] is a 30 y.o male with a history of substance abuse who presents for suicidal ideation and use of multiple drugs. States he used MDMA and DXM last night.  He took a whole packet of chloricidine last night and a dose of methadone this morning. Seen at [facility] for methadone.  Last heroin use was 3 days ago. Reports that he is suicidal with the plan to jump in front of a car. He reports using prescription pills for the last 10 years. Admits to drinking one beer last night. Has been homeless since leaving jail in 2015. States he's had continuous leg pain since last year with acute rhabdomyolysis after overdosing on heroin. Requesting pain medication for leg pain.  Denies any recent illness, chest pain, shortness of breath, abdominal pain, nausea, vomiting. Denies HI or hallucinations.   Past Medical History  Diagnosis Date  . Substance abuse     heroin, cocaine   History reviewed. No pertinent past surgical history. Family History  Problem Relation Age of Onset  . Alcohol abuse Mother    Social History  Substance Use Topics  . Smoking status: Current Every Day Smoker    Types: Cigarettes  . Smokeless tobacco: None  . Alcohol Use: 0.6 oz/week    1 Cans of beer per week    Review of Systems  Respiratory: Negative for shortness of breath.   Cardiovascular: Negative for chest pain.  Musculoskeletal: Positive for myalgias.  All other systems reviewed and are  negative.     Allergies  Review of patient's allergies indicates no known allergies.  Home Medications   Prior to Admission medications   Medication Sig Start Date End Date Taking? Authorizing Provider  DEXTROMETHORPHAN HBR PO Take by mouth.   Yes Historical Provider, MD  methadone (DOLOPHINE) 10 MG/5ML solution Take 33 mg by mouth every 6 (six) hours as needed for pain.   Yes Historical Provider, MD  oxyCODONE (OXY IR/ROXICODONE) 5 MG immediate release tablet Take 1 tablet (5 mg total) by mouth every 4 (four) hours as needed for moderate pain. Patient not taking: Reported on 03/17/2015 10/08/14   Leana Roe Elgergawy, MD   BP 125/70 mmHg  Pulse 66  Temp(Src) 98.3 F (36.8 C) (Oral)  Resp 18  SpO2 98% Physical Exam  Constitutional: He is oriented to person, place, and time. He appears well-developed and well-nourished.  HENT:  Head: Normocephalic and atraumatic.  Eyes: Conjunctivae are normal.  Neck: Normal range of motion. Neck supple.  Cardiovascular: Normal rate, regular rhythm and normal heart sounds.   Pulmonary/Chest: Effort normal and breath sounds normal. No respiratory distress. He has no wheezes.  Abdominal: Soft. There is no tenderness.  Musculoskeletal: Normal range of motion.  Neurological: He is alert and oriented to person, place, and time.  Skin: Skin is warm and dry.  Psychiatric:  SI with plan to jump in front of car. Denies HI.  Nursing note and vitals reviewed.   ED Course  Procedures (including critical care time) Labs Review  Labs Reviewed  COMPREHENSIVE METABOLIC PANEL - Abnormal; Notable for the following:    Glucose, Bld 125 (*)    All other components within normal limits  ACETAMINOPHEN LEVEL - Abnormal; Notable for the following:    Acetaminophen (Tylenol), Serum <10 (*)    All other components within normal limits  URINE RAPID DRUG SCREEN, HOSP PERFORMED - Abnormal; Notable for the following:    Opiates POSITIVE (*)    Cocaine POSITIVE (*)     Tetrahydrocannabinol POSITIVE (*)    All other components within normal limits  ETHANOL  SALICYLATE LEVEL  CBC    Imaging Review No results found. I have personally reviewed and evaluated these lab results as part of my medical decision-making.   EKG Interpretation None      MDM   Final diagnoses:  Substance abuse  Suicidal ideation   Patient presents for SI and substance abuse. Currently on methadone as prescribed by an outpatient substance abuse clinic. Last dose was this morning. Also reports to taking dextromethorphan and ecstasy last night. Reports continued leg pain for over 6 months since last hospitalization for rhabdomyolysis and overdose of heroin.  Patient is currently stable and eating a sandwich at bedside. Vitals are normal. Patient is stable. No other concerns for withdrawal at this time. Patient is positive for opiates, cocaine, and THC. Otherwise, basic labs are not concerning. I discussed this patient with Behavioral Health who stated that he would need inpatient treatment. He was transferred to behavioral health.  Filed Vitals:   03/17/15 0934 03/17/15 1152  BP: 112/72 125/70  Pulse: 76 66  Temp: 97.8 F (36.6 C) 98.3 F (36.8 C)  Resp: 20 18   Medications - No data to display   Catha Gosselin, PA-C 03/17/15 1609  Doug Sou, MD 03/17/15 1743
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
## 2015-03-17 NOTE — Progress Notes (Signed)
D:Patient in bed on approach.  Patient has been sleeping but appears restless.  Patient states he is sad but wants to get into some type of long term treatment.  Patient states he currently is enrolled in a methadone clinic.  Patient currently denies SI/HI and denies AVH.  Patient has been eating well today. A: Staff to monitor Q 15 mins for safety.  Encouragement and support offered.  Scheduled medications administered per orders.  Vistaril administered prn for anxiety.  Trazodone administered prn for sleep. R: Patient remains safe on the unit.

## 2015-03-17 NOTE — ED Notes (Signed)
Sitter at bedside with patient.

## 2015-03-17 NOTE — Progress Notes (Signed)
Discussed pt's case with Claudette Head, DNP, who advised observation unit admission is recommended. Pt accepted to Obs bed 3 by Dr. Lucianne Muss per [facility].  Spoke with [facility]. Report can be called at [phone] and pt can arrive anytime.   Ilean Skill, MSW, LCSW Clinical Social Work, Disposition  03/17/[id number]

## 2015-03-17 NOTE — ED Notes (Signed)
Pt here for problems with multiple drugs and sts that he is having suicidal thoughts. sts used MDMA last night. sts he has been getting methadone for heroine addiction.

## 2015-03-17 NOTE — BH Assessment (Signed)
BHH Assessment Progress Note  This Clinical research associate spoke with patient on admission to discuss treatment options and patient's history. Patient states he is currently receiving methadone from [facility] but has been unsuccessful at maintaining his sobriety which has jeopardized his treatment efforts at [facility]. Patient stated he has also been homeless since his Grandmother ask him to leave her residence due to his drug use. Patient stated all of the stressors contributed to the feelings of self harm this date with patient stating he wanted to "just step in front of a car." This Clinical research associate and patient discussed what treatment options may be available for him including possible residential treatment. Patient will contact [facility] at 9:00 a.m. on 03/18/15 to obtain information and have interview conducted. Outpatient resources will be made available to patient on discharge. Patient will be re-evaluated in the a.m. to determine disposition.

## 2015-03-17 NOTE — ED Notes (Signed)
A regular diet ordered for patient for lunch. 

## 2015-03-17 NOTE — BH Assessment (Signed)
TTS ([place]) will assess the patient.  

## 2015-03-17 NOTE — ED Notes (Signed)
Pt. States he presents for concern over substance abuse. Pt. States he would ultimately like to enter a treatment program. Pt. States he has used prescription pills for 8-9 years, has been using heroin x 1.5 years. States last heroin use 3 days ago. Pt. States he took variety of drugs including molly and ectasy yesterday. Pt. Denies heavy alcohol use. Pt. States he left jail in October of 2015, since has been homeless. Pt. Endorses feeling hopeless and endorses thoughts of harming self. No previous pysch hx, no prior attempts to harm self. Pt. States he thought about stepping in front of a car on the way here.

## 2015-03-17 NOTE — Progress Notes (Signed)
Pt admitted to [facility] for suicidal ideation with a plan to jump in front of traffic. Pt currently denies SI, and verbally contracts for safety while in unit. Pt has extensive substance abuse history which  includes Heroin use x8 years, cocaine, MDMA, DXM marijuana and fentanyl. Pt stated previously  OD on  heroin and fentanyl. Pt reports being homeless and is unsure if he can return to the shelters. Pt states using drugs to control pain. Pt states he does not have insurance otherwise he would go to the pain clinic for treatment. Pt has no support system other than grandmother that lives local. Pt reports grandmother giving ":tough love" Pt stressors are financial, housing as well as reporting broken relationships. Search completed, items stored in locker #22. Nourishment offered. Pt remains safe on unit with continual observation.

## 2015-03-18 DIAGNOSIS — F1994 Other psychoactive substance use, unspecified with psychoactive substance-induced mood disorder: Secondary | ICD-10-CM | POA: Diagnosis not present

## 2015-03-18 DIAGNOSIS — F1910 Other psychoactive substance abuse, uncomplicated: Secondary | ICD-10-CM

## 2015-03-18 DIAGNOSIS — F332 Major depressive disorder, recurrent severe without psychotic features: Secondary | ICD-10-CM | POA: Diagnosis not present

## 2015-03-18 MED ORDER — NICOTINE 21 MG/24HR TD PT24: 21.0000 mg | MEDICATED_PATCH | Freq: Every day | TRANSDERMAL | Status: DC

## 2015-03-18 MED ORDER — HYDROXYZINE HCL 25 MG PO TABS: 25.0000 mg | ORAL_TABLET | Freq: Four times a day (QID) | ORAL | Status: DC | PRN

## 2015-03-18 MED ORDER — TRAZODONE HCL 50 MG PO TABS: 50.0000 mg | ORAL_TABLET | Freq: Every evening | ORAL | Status: DC | PRN

## 2015-03-18 NOTE — BHH Counselor (Signed)
This Clinical research associate discussed treatment options for pt. , and explained that residential treatment would not provide methadone, and expressed the concerns and  Potential complication involved with treatment and sudden discontinuation of methadone while receiving residential care for chemical dependency. Pt. Stated that still wished to seek inpatient residential treatment at [facility] or [facility] for continuation of care. Patient signed and was explained ROI for referral services and packet was sent to intake at [facility] and [facility]. For immediate needs pt. Was explained services and registation at [facility] in order to obtain shelter/ housing, as pt. stated that he is currently homeless. Jedrick Hutcherson K. Jesus Genera, [facility]  Counselor 03/18/2015 11:41 AM
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
## 2015-03-18 NOTE — Progress Notes (Signed)
D: Patient resting in bed with eyes closed.  Respirations even and unlabored.  Patient appears to be in no apparent distress. A: Staff to monitor Q 15 mins for safety.   R:Patient remains safe on the unit.  
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
## 2015-03-18 NOTE — Discharge Instructions (Signed)
For your immediate shelter/ housing needs you are encouraged to go to [facility]  for services and resources. You are encouraged to follow up with [facility] for continuation of care. You are also encouraged to follow up with [facility] residential for follow up interview and consideration/ details on inpatient residential treatment for chemical dependency.

## 2015-03-18 NOTE — Progress Notes (Signed)
Pt discharged home. DC instructions provided and explained. Medications reviewed. Rx given. All questions answered, pt stable at discharge. Denies SI, HI, AVH. Pt remained safe on unit with with continuous observation, except during bathroom usage

## 2015-07-29 ENCOUNTER — Encounter (HOSPITAL_COMMUNITY): Payer: Self-pay

## 2015-07-29 ENCOUNTER — Emergency Department (HOSPITAL_COMMUNITY)
Admission: EM | Admit: 2015-07-29 | Discharge: 2015-07-30 | Disposition: A | Discharge status: Other Acute Inpatient Hospital | Payer: Self-pay | Attending: Emergency Medicine | Admitting: Emergency Medicine

## 2015-07-29 DIAGNOSIS — F1424 Cocaine dependence with cocaine-induced mood disorder: Secondary | ICD-10-CM | POA: Insufficient documentation

## 2015-07-29 DIAGNOSIS — F17210 Nicotine dependence, cigarettes, uncomplicated: Secondary | ICD-10-CM | POA: Insufficient documentation

## 2015-07-29 LAB — COMPREHENSIVE METABOLIC PANEL
ALBUMIN: 5.1 g/dL — AB (ref 3.5–5.0)
ALT: 17 U/L (ref 17–63)
ANION GAP: 9 (ref 5–15)
AST: 20 U/L (ref 15–41)
Alkaline Phosphatase: 79 U/L (ref 38–126)
BUN: 19 mg/dL (ref 6–20)
CHLORIDE: 109 mmol/L (ref 101–111)
CO2: 23 mmol/L (ref 22–32)
Calcium: 9.7 mg/dL (ref 8.9–10.3)
Creatinine, Ser: 0.98 mg/dL (ref 0.61–1.24)
GFR calc non Af Amer: >60 mL/min (ref >60)
GLUCOSE: 75 mg/dL (ref 65–99)
POTASSIUM: 4.2 mmol/L (ref 3.5–5.1)
SODIUM: 141 mmol/L (ref 135–145)
Total Bilirubin: 0.5 mg/dL (ref 0.3–1.2)
Total Protein: 7.8 g/dL (ref 6.5–8.1)

## 2015-07-29 LAB — ETHANOL: Alcohol, Ethyl (B): <5 mg/dL (ref <5)

## 2015-07-29 LAB — SALICYLATE LEVEL

## 2015-07-29 LAB — RAPID URINE DRUG SCREEN, HOSP PERFORMED
AMPHETAMINES: POSITIVE — AB
BENZODIAZEPINES: NOT DETECTED
Barbiturates: NOT DETECTED
COCAINE: POSITIVE — AB
OPIATES: NOT DETECTED
TETRAHYDROCANNABINOL: NOT DETECTED

## 2015-07-29 LAB — ACETAMINOPHEN LEVEL

## 2015-07-29 LAB — CBC
HEMATOCRIT: 42.7 % (ref 39.0–52.0)
HEMOGLOBIN: 15.1 g/dL (ref 13.0–17.0)
MCH: 28 pg (ref 26.0–34.0)
MCHC: 35.4 g/dL (ref 30.0–36.0)
MCV: 79.1 fL (ref 78.0–100.0)
Platelets: 202 10*3/uL (ref 150–400)
RBC: 5.4 MIL/uL (ref 4.22–5.81)
RDW: 13.4 % (ref 11.5–15.5)
WBC: 8.7 10*3/uL (ref 4.0–10.5)

## 2015-07-29 MED ORDER — TRAZODONE HCL 50 MG PO TABS
50.0000 mg | ORAL_TABLET | Freq: Every evening | ORAL | Status: DC | PRN
  Administered 2015-07-29: 50 mg via ORAL
  Filled 2015-07-29: qty 1

## 2015-07-29 NOTE — ED Provider Notes (Signed)
CSN: [id number]     Arrival date & time 07/29/15  [id number] History   First MD Initiated Contact with Patient 07/29/15 1043     Chief Complaint  Patient presents with  . Suicidal  . Addiction Problem    HPI Comments: 30 year old male presents with a suicide attempt and polysubstance abuse. Past medical history significant for anxiety, long standing polysubstance abuse, hx of prior overdose. Patient states he is homeless - does not stay at a shelter. He decided to attempt suicide by hanging himself yesterday due to problems with his drug dealer. 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 He also states that he has some auditory hallucinations but is not able to specifically describe these. He denies fever, headache, chest pain, shortness of breath, abdominal pain. He is not currently on any psych medicines. He was treated at [facility] for substance abuse issues in 2016.       Past Medical History  Diagnosis Date  . Substance abuse     heroin, cocaine   History reviewed. No pertinent past surgical history. Family History  Problem Relation Age of Onset  . Alcohol abuse Mother    Social History  Substance Use Topics  . Smoking status: Current Every Day Smoker    Types: Cigarettes  . Smokeless tobacco: None  . Alcohol Use: 0.6 oz/week    1 Cans of beer per week    Review of Systems  Constitutional: Negative for fever.  Respiratory: Negative for shortness of breath.   Cardiovascular: Negative for chest pain.  Gastrointestinal: Negative for abdominal pain.  Skin: Negative for rash and wound.  Psychiatric/Behavioral: Positive for suicidal ideas, hallucinations and self-injury.  All other systems reviewed and are negative.     Allergies  Review of patient's allergies indicates no known allergies.  Home Medications   Prior to Admission medications   Medication Sig  Start Date End Date Taking? Authorizing Provider  gabapentin (NEURONTIN) 300 MG capsule Take 300 mg by mouth 3 (three) times daily.   Yes Historical Provider, MD  hydrOXYzine (VISTARIL) 50 MG capsule Take 50 mg by mouth 2 (two) times daily.   Yes Historical Provider, MD  traZODone (DESYREL) 50 MG tablet Take 1 tablet (50 mg total) by mouth at bedtime as needed for sleep. Patient taking differently: Take 50 mg by mouth at bedtime.  03/18/15  Yes Beau FannyJohn C Withrow, FNP  hydrOXYzine (ATARAX/VISTARIL) 25 MG tablet Take 1 tablet (25 mg total) by mouth every 6 (six) hours as needed for anxiety. Patient not taking: Reported on 07/29/2015 03/18/15   Beau FannyJohn C Withrow, FNP  nicotine (NICODERM CQ - DOSED IN MG/24 HOURS) 21 mg/24hr patch Place 1 patch (21 mg total) onto the skin daily. Patient not taking: Reported on 07/29/2015 03/18/15   Beau FannyJohn C Withrow, FNP   BP 115/74 mmHg  Pulse 78  Temp(Src) 97.9 F (36.6 C) (Oral)  Resp 16  SpO2 100%   Physical Exam  Constitutional: He is oriented to person, place, and time. He appears well-developed and well-nourished. No distress.  Unkempt  HENT:  Head: Normocephalic and atraumatic.  Eyes: Conjunctivae are normal. Pupils are equal, round, and reactive to light. Right eye exhibits no discharge. Left eye exhibits no discharge. No scleral icterus.  Neck: Normal range of motion.  Cardiovascular: Normal rate and regular rhythm.  Exam reveals no gallop and no friction rub.   No murmur heard.  Pulmonary/Chest: Effort normal and breath sounds normal. No respiratory distress. He has no wheezes. He has no rales. He exhibits no tenderness.  Abdominal: Soft. He exhibits no distension. There is no tenderness.  Neurological: He is alert and oriented to person, place, and time.  Skin: Skin is warm and dry.  Psychiatric: His speech is normal and behavior is normal. His affect is blunt. He is not agitated and not actively hallucinating. Thought content is not paranoid and not delusional.  Cognition and memory are normal. He exhibits a depressed mood. He expresses suicidal ideation. He expresses no homicidal ideation. He expresses suicidal plans. He expresses no homicidal plans.    ED Course  Procedures (including critical care time) Labs Review Labs Reviewed  COMPREHENSIVE METABOLIC PANEL - Abnormal; Notable for the following:    Albumin 5.1 (*)    All other components within normal limits  ACETAMINOPHEN LEVEL - Abnormal; Notable for the following:    Acetaminophen (Tylenol), Serum <10 (*)    All other components within normal limits  URINE RAPID DRUG SCREEN, HOSP PERFORMED - Abnormal; Notable for the following:    Cocaine POSITIVE (*)    Amphetamines POSITIVE (*)    All other components within normal limits  ETHANOL  SALICYLATE LEVEL  CBC    Imaging Review No results found. I have personally reviewed and evaluated these images and lab results as part of my medical decision-making.   EKG Interpretation None      MDM   Final diagnoses:  Cocaine dependence with cocaine-induced mood disorder (HCC)   30 year old male presenting with suicidal ideation in the setting of polysubstance abuse. All vital signs are within normal limits and stable. All labs are unremarkable. UDS is remarkable for cocaine and amphetamines. Will consult TTS for further evaluation   Bethel BornKelly Marie Gekas, PA-C 07/30/15 1850  Mancel BaleElliott Wentz, MD 07/31/15 [phone]

## 2015-07-29 NOTE — BH Assessment (Signed)
Tele Assessment Note   Shariq Lum BabeRyan Pickford is an 30 y.o. male. Pt reports SI with a plan to hang himself. Pt denies HI. Pt denies AVH. Pt reports polysubstance abuse. Pt uses heroin, crack cocaine, and powder cocaine. Pt states he uses "as much as I can get." Pt has received inpatient treatment at [facility] and [facility]. Pt states he is prescribed Visitaril, Gabbapentin, and Trazodone from his psychiatrist at [facility]. Pt denies abuse. Pt denies family or friends support.   Writer consulted with Catha NottinghamJamison, DNP. Per Catha NottinghamJamison, DNP Pt meets Obs criteria.   Diagnosis:  F11.20 Opioid use, severe; F32.9 Unspecified depressive disorder  Past Medical History:  Past Medical History  Diagnosis Date  . Substance abuse     heroin, cocaine    History reviewed. No pertinent past surgical history.  Family History:  Family History  Problem Relation Age of Onset  . Alcohol abuse Mother     Social History:  reports that he has been smoking Cigarettes.  He does not have any smokeless tobacco history on file. He reports that he drinks about 0.6 oz of alcohol per week. He reports that he uses illicit drugs (Cocaine, Heroin, MDMA (Ecstacy), Fentanyl, and Marijuana) about once per week.  Additional Social History:  Alcohol / Drug Use Pain Medications: Pt denies Prescriptions: Visitaril, trazodone, Gabbapatin Over the Counter: Pt denies  History of alcohol / drug use?: Yes Longest period of sobriety (when/how long): unknown Negative Consequences of Use: Financial, Legal, Personal relationships, Work / School Substance #1 Name of Substance 1: cocaine 1 - Age of First Use: unknown 1 - Amount (size/oz): "as much as I can get" 1 - Frequency: daily  1 - Duration: ongoing 1 - Last Use / Amount: 07/29/15 Substance #2 Name of Substance 2: heroin 2 - Age of First Use: unknown 2 - Amount (size/oz): "as much as I can get" 2 - Frequency: daily 2 - Duration: ongoing 2 - Last Use / Amount: 07/29/15  CIWA:  CIWA-Ar BP: 115/74 mmHg Pulse Rate: 78 COWS:    PATIENT STRENGTHS: (choose at least two) Capable of independent living Communication skills  Allergies: No Known Allergies  Home Medications:  (Not in a hospital admission)  OB/GYN Status:  No LMP for male patient.  General Assessment Data Location of Assessment: [facility] ED TTS Assessment: In system Is this a Tele or Face-to-Face Assessment?: Face-to-Face Is this an Initial Assessment or a Re-assessment for this encounter?: Initial Assessment Marital status: Single Maiden name: NA Is patient pregnant?: No Pregnancy Status: No Living Arrangements: Other (Comment) (homeless) Can pt return to current living arrangement?: No Admission Status: Voluntary Is patient capable of signing voluntary admission?: Yes Referral Source: Self/Family/Friend Insurance type: SP     Crisis Care Plan Living Arrangements: Other (Comment) (homeless) Legal Guardian: Other: (self) Name of Psychiatrist: NA Name of Therapist: NA  Education Status Is patient currently in school?: No Current Grade: NA Highest grade of school patient has completed: some college Name of school: NA Contact person: NA  Risk to self with the past 6 months Suicidal Ideation: Yes-Currently Present Has patient been a risk to self within the past 6 months prior to admission? : No Suicidal Intent: Yes-Currently Present Has patient had any suicidal intent within the past 6 months prior to admission? : No Is patient at risk for suicide?: Yes Suicidal Plan?: Yes-Currently Present Has patient had any suicidal plan within the past 6 months prior to admission? : No Specify Current Suicidal Plan:  to hang himself Access to Means: Yes Specify Access to Suicidal Means: access to rope What has been your use of drugs/alcohol within the last 12 months?: cocaine and heroin Previous Attempts/Gestures: Yes How many times?: 1 Other Self Harm Risks: Sa Triggers for Past Attempts: None  known Intentional Self Injurious Behavior: None Family Suicide History: No Recent stressful life event(s): Other (Comment) (homelessness) Persecutory voices/beliefs?: No Depression: Yes Depression Symptoms: Insomnia, Tearfulness, Isolating, Fatigue, Feeling worthless/self pity, Loss of interest in usual pleasures, Feeling angry/irritable Substance abuse history and/or treatment for substance abuse?: Yes Suicide prevention information given to non-admitted patients: Not applicable  Risk to Others within the past 6 months Homicidal Ideation: No Does patient have any lifetime risk of violence toward others beyond the six months prior to admission? : No Thoughts of Harm to Others: No Current Homicidal Intent: No Current Homicidal Plan: No Access to Homicidal Means: No Identified Victim: NA History of harm to others?: No Assessment of Violence: None Noted Violent Behavior Description: NA Does patient have access to weapons?: No Criminal Charges Pending?: No Does patient have a court date: No Is patient on probation?: No  Psychosis Hallucinations: None noted Delusions: None noted  Mental Status Report Appearance/Hygiene: Disheveled Eye Contact: Fair Motor Activity: Freedom of movement Speech: Logical/coherent Level of Consciousness: Alert Mood: Depressed Affect: Depressed Anxiety Level: Minimal Thought Processes: Coherent, Relevant Judgement: Unimpaired Orientation: Person, Place, Time, Situation Obsessive Compulsive Thoughts/Behaviors: None  Cognitive Functioning Concentration: Normal Memory: Recent Intact, Remote Intact IQ: Average Insight: Fair Impulse Control: Fair Appetite: Fair Weight Loss: 0 Weight Gain: 0 Sleep: Decreased Total Hours of Sleep: 5 Vegetative Symptoms: None  ADLScreening [facility] Assessment [facility]) Patient's cognitive ability adequate to safely complete daily activities?: Yes Patient able to express need for assistance with ADLs?:  Yes Independently performs ADLs?: Yes (appropriate for developmental age)  Prior Inpatient Therapy Prior Inpatient Therapy: Yes Prior Therapy Dates: 2017 [facility] Reason for Treatment: SA and depression  Prior Outpatient Therapy Prior Outpatient Therapy: Yes Prior Therapy Dates: 2017 Prior Therapy Facilty/Provider(s): [facility] Reason for Treatment: SA Does patient have an ACCT team?: No Does patient have Intensive In-House Services?  : No Does patient have [facility] services? : No Does patient have P4CC services?: No  ADL Screening (condition at time of admission) Patient's cognitive ability adequate to safely complete daily activities?: Yes Is the patient deaf or have difficulty hearing?: No Does the patient have difficulty seeing, even when wearing glasses/contacts?: No Does the patient have difficulty concentrating, remembering, or making decisions?: No Patient able to express need for assistance with ADLs?: Yes Does the patient have difficulty dressing or bathing?: No Independently performs ADLs?: Yes (appropriate for developmental age) Does the patient have difficulty walking or climbing stairs?: No Weakness of Legs: None Weakness of Arms/Hands: None       Abuse/Neglect Assessment (Assessment to be complete while patient is alone) Physical Abuse: Denies Verbal Abuse: Denies Sexual Abuse: Denies Exploitation of patient/patient's resources: Denies Self-Neglect: Denies Values / Beliefs Cultural Requests During Hospitalization: None Spiritual Requests During Hospitalization: None   Advance Directives (For Healthcare) Does patient have an advance directive?: No Would patient like information on creating an advanced directive?: No - patient declined information    Additional Information 1:1 In Past 12 Months?: No CIRT Risk: No Elopement Risk: No Does patient have medical clearance?: Yes     Disposition:  Disposition Initial Assessment  Completed for this Encounter: Yes Disposition of Patient: Other dispositions (OBs) Other disposition(s): Other (Comment) (  Obs)  Aljean Horiuchi D 07/29/2015 3:55 PM

## 2015-07-29 NOTE — ED Notes (Signed)
Pt given sprite and a sandwich.

## 2015-07-29 NOTE — ED Notes (Signed)
Pt states that he tried to hang himself yesterday. He continues to have SI. Denies HI. Is not delusional and not responding to internal stimuli.

## 2015-07-29 NOTE — ED Notes (Signed)
Pt sitting up at bedside, watching TV at present.  No distress noted, calm & cooperative, A&Ox3, no distress noted.  Monitoring for safety, Q 15 min checks in effect.

## 2015-07-29 NOTE — Progress Notes (Addendum)
Pt unable to be aroused  CM left pt [place] uninsured resources in his pt belonging bag in his locker #34 Provided written information to assist pt with determining choice for uninsured accepting pcps, discussed the importance of pcp vs EDP services for f/u care, www.needymeds.org, www.goodrx.com, discounted pharmacies and other [place] resources such as [facility] , [facility], affordable care act, financial assistance, uninsured dental services, [facility] med assist, DSS and  health department  Reviewed resources for [place] uninsured accepting pcps like [facility], family medicine at [address], [facility], [facility], local urgent care centers, [facility], [facility] family practice, general medical clinics, family services of the [place], [facility] urgent care plus others, medication resources, [facility] out patient [facility]ies and housing  Entered in d/c instructions Please use the resources provided to you in emergency room by case manager to assist you're your choice of doctor for follow up These [place] uninsured resources provide possible primary care providers, resources for discounted medications, housing, dental resources, affordable care act information, plus other resources for [place]

## 2015-07-29 NOTE — ED Notes (Addendum)
Pt presents w/ [facility] d/t drug addiction (sts he will use "anything" and "everything") and SI w/ plan to hang himself.  Last cocaine use this morning.  Pt reports that he attempted to hang himself last night. Sts "it was about half an attempt."  No injuries noted.    Also, pt reports that he is in "a bad situation w/ a drug dealer" d/t "owing them money."  Pt reports that the dealer saw him last night and feels that people are after him.  Pt is homeless.

## 2015-07-29 NOTE — ED Notes (Signed)
Pt given another Sprite in addition to meal tray.

## 2015-07-30 ENCOUNTER — Encounter (HOSPITAL_COMMUNITY): Payer: Self-pay | Admitting: *Deleted

## 2015-07-30 ENCOUNTER — Observation Stay (HOSPITAL_COMMUNITY)
Admission: AD | Admit: 2015-07-30 | Discharge: 2015-07-31 | Disposition: A | Discharge status: Home / Self Care | Payer: Self-pay | Source: Intra-hospital | Attending: Psychiatry | Admitting: Psychiatry

## 2015-07-30 DIAGNOSIS — F17210 Nicotine dependence, cigarettes, uncomplicated: Secondary | ICD-10-CM | POA: Insufficient documentation

## 2015-07-30 DIAGNOSIS — R45851 Suicidal ideations: Secondary | ICD-10-CM | POA: Insufficient documentation

## 2015-07-30 DIAGNOSIS — F1424 Cocaine dependence with cocaine-induced mood disorder: Secondary | ICD-10-CM | POA: Diagnosis present

## 2015-07-30 DIAGNOSIS — F1110 Opioid abuse, uncomplicated: Secondary | ICD-10-CM | POA: Insufficient documentation

## 2015-07-30 DIAGNOSIS — F411 Generalized anxiety disorder: Secondary | ICD-10-CM | POA: Insufficient documentation

## 2015-07-30 DIAGNOSIS — F332 Major depressive disorder, recurrent severe without psychotic features: Secondary | ICD-10-CM | POA: Insufficient documentation

## 2015-07-30 DIAGNOSIS — F1290 Cannabis use, unspecified, uncomplicated: Secondary | ICD-10-CM | POA: Insufficient documentation

## 2015-07-30 DIAGNOSIS — F1990 Other psychoactive substance use, unspecified, uncomplicated: Secondary | ICD-10-CM | POA: Insufficient documentation

## 2015-07-30 MED ORDER — GABAPENTIN 300 MG PO CAPS
300.0000 mg | ORAL_CAPSULE | Freq: Four times a day (QID) | ORAL | Status: DC
  Administered 2015-07-30: 300 mg via ORAL
  Filled 2015-07-30: qty 1

## 2015-07-30 MED ORDER — METHOCARBAMOL 500 MG PO TABS
500.0000 mg | ORAL_TABLET | Freq: Three times a day (TID) | ORAL | Status: DC | PRN
  Administered 2015-07-31: 500 mg via ORAL
  Filled 2015-07-30: qty 1

## 2015-07-30 MED ORDER — HYDROXYZINE HCL 25 MG PO TABS: 50.0000 mg | ORAL_TABLET | Freq: Three times a day (TID) | ORAL | Status: DC | PRN

## 2015-07-30 MED ORDER — HYDROXYZINE HCL 50 MG PO TABS
50.0000 mg | ORAL_TABLET | Freq: Three times a day (TID) | ORAL | Status: DC | PRN
  Administered 2015-07-30: 50 mg via ORAL
  Filled 2015-07-30: qty 1

## 2015-07-30 MED ORDER — GABAPENTIN 300 MG PO CAPS
300.0000 mg | ORAL_CAPSULE | Freq: Four times a day (QID) | ORAL | Status: DC
  Administered 2015-07-30 – 2015-07-31 (×4): 300 mg via ORAL
  Filled 2015-07-30 (×3): qty 1
  Filled 2015-07-30: qty 3

## 2015-07-30 MED ORDER — ACETAMINOPHEN 325 MG PO TABS
650.0000 mg | ORAL_TABLET | Freq: Four times a day (QID) | ORAL | Status: DC | PRN
  Administered 2015-07-30 – 2015-07-31 (×2): 650 mg via ORAL
  Filled 2015-07-30 (×2): qty 2

## 2015-07-30 MED ORDER — TRAZODONE HCL 100 MG PO TABS
100.0000 mg | ORAL_TABLET | Freq: Every day | ORAL | Status: DC
  Administered 2015-07-30: 100 mg via ORAL
  Filled 2015-07-30: qty 1

## 2015-07-30 MED ORDER — MAGNESIUM HYDROXIDE 400 MG/5ML PO SUSP: 30.0000 mL | Freq: Every day | ORAL | Status: DC | PRN

## 2015-07-30 MED ORDER — TRAZODONE HCL 100 MG PO TABS: 100.0000 mg | ORAL_TABLET | Freq: Every day | ORAL | Status: DC

## 2015-07-30 MED ORDER — CLONIDINE HCL 0.1 MG PO TABS
0.1000 mg | ORAL_TABLET | Freq: Three times a day (TID) | ORAL | Status: DC | PRN
  Administered 2015-07-30: 0.1 mg via ORAL
  Filled 2015-07-30: qty 1

## 2015-07-30 MED ORDER — ALUM & MAG HYDROXIDE-SIMETH 200-200-20 MG/5ML PO SUSP: 30.0000 mL | ORAL | Status: DC | PRN

## 2015-07-30 NOTE — H&P (Signed)
BH Observation Unit Provider Admission PAA/H&P  Patient Identification: Charles Ortega MRN:  [id number] Date of Evaluation:  07/30/2015 Chief Complaint:  Patient states "I am tired of using drugs, tried to hang myself in a hotel."  Principal Diagnosis: Cocaine dependence with cocaine-induced mood disorder (HCC) Diagnosis:   Patient Active Problem List   Diagnosis Date Noted  . Cocaine dependence with cocaine-induced mood disorder (HCC) [F14.24] 07/30/2015  . Substance induced mood disorder (HCC) [F19.94] 03/17/2015  . MDD (major depressive disorder), recurrent severe, without psychosis (HCC) [F33.2] 03/17/2015  . Acute encephalopathy [G93.40]   . SIRS (systemic inflammatory response syndrome) (HCC) [R65.10]   . Shock liver [K75.9]   . Acute renal failure syndrome (HCC) [N17.9]   . Hypokalemia [E87.6]   . Mononeuropathy [G58.9]   . Hyponatremia [E87.1]   . Transaminitis [R74.0]   . Rhabdomyolysis [M62.82] 10/03/2014  . AKI (acute kidney injury) (HCC) [N17.9] 10/03/2014  . Elevated transaminase level [R74.0] 10/03/2014  . Polysubstance abuse [F19.10] 10/03/2014  . Leukocytosis [D72.829] 10/03/2014  . Weakness [R53.1]    History of Present Illness:   Charles Ortega is a 37 male who presented to [facility] reporting a plan to hang himself. Patient uses heroin, crack cocaine, and powder cocaine. The patient reports daily use in varying amounts depending on the availability. Patient has received inpatient treatment at [facility] and outpatient services at [facility]. Patient states he is prescribed Visitaril, Gabbapentin, and Trazodone from his psychiatrist at [facility]. Patient denies any past abuse. Patient states "I feel very depressed. I have been staying at a hotel. I tried to hang myself there. I have been abusing heroin/cocaine for the past two years. I have a drug dealer who is after me. I owe them money. I feel scared. I want a treatment center then a program like [facility]. I am still thinking of ending my life. I  do not want to hurt anyone else and I do not hear voices. I think the opiates make the depression worse. I have not used any in three days but still feel some mild body aches, cramps, and my nose has been runny." On admission his urine drug screen is positive for cocaine and amphetamines. Patient appears depressed throughout the assessment. He is unable to contract for safety outside the hospital.   Associated Signs/Symptoms: Depression Symptoms:  depressed mood, anhedonia, psychomotor retardation, fatigue, feelings of worthlessness/guilt, hopelessness, recurrent thoughts of death, suicidal thoughts with specific plan, suicidal attempt, anxiety, loss of energy/fatigue, (Hypo) Manic Symptoms:  Denies Anxiety Symptoms:  Excessive Worry, Psychotic Symptoms:  Denies PTSD Symptoms: Negative Total Time spent with patient: 30 minutes  Past Psychiatric History: Depression  Is the patient at risk to self? Yes.    Has the patient been a risk to self in the past 6 months? Yes.    Has the patient been a risk to self within the distant past? No.  Is the patient a risk to others? No.  Has the patient been a risk to others in the past 6 months? No.  Has the patient been a risk to others within the distant past? No.   Prior Inpatient Therapy:  yes Prior Outpatient Therapy:   yes at [facility]  Alcohol Screening:   Substance Abuse History in the last 12 months:  Yes.   Consequences of Substance Abuse: Withdrawal Symptoms:   Cramps runny nose, body aches  Previous Psychotropic Medications: Yes  Psychological Evaluations: No  Past Medical History:  Past Medical History  Diagnosis Date  .  Substance abuse     heroin, cocaine   History reviewed. No pertinent past surgical history. Family History:  Family History  Problem Relation Age of Onset  . Alcohol abuse Mother    Family Psychiatric History: Reports mother struggles with alcohol abuse  Tobacco Screening: Smokes one pack per day Social  History:  History  Alcohol Use  . 0.6 oz/week  . 1 Cans of beer per week     History  Drug Use  . 1.00 per week  . Special: Cocaine, Heroin, MDMA (Ecstacy), Fentanyl, Marijuana    Comment: heroin- Pt sts "anything and everything"    Additional Social History:                           Allergies:  No Known Allergies Lab Results:  Results for orders placed or performed during the hospital encounter of 07/29/15 (from the past 48 hour(s))  Rapid urine drug screen (hospital performed)     Status: Abnormal   Collection Time: 07/29/15 10:12 AM  Result Value Ref Range   Opiates NONE DETECTED NONE DETECTED   Cocaine POSITIVE (A) NONE DETECTED   Benzodiazepines NONE DETECTED NONE DETECTED   Amphetamines POSITIVE (A) NONE DETECTED   Tetrahydrocannabinol NONE DETECTED NONE DETECTED   Barbiturates NONE DETECTED NONE DETECTED    Comment:        DRUG SCREEN FOR MEDICAL PURPOSES ONLY.  IF CONFIRMATION IS NEEDED FOR ANY PURPOSE, NOTIFY LAB WITHIN 5 DAYS.        LOWEST DETECTABLE LIMITS FOR URINE DRUG SCREEN Drug Class       Cutoff (ng/mL) Amphetamine      1000 Barbiturate      200 Benzodiazepine   253 Tricyclics       664 Opiates          300 Cocaine          300 THC              50   Comprehensive metabolic panel     Status: Abnormal   Collection Time: 07/29/15 10:22 AM  Result Value Ref Range   Sodium 141 135 - 145 mmol/L   Potassium 4.2 3.5 - 5.1 mmol/L   Chloride 109 101 - 111 mmol/L   CO2 23 22 - 32 mmol/L   Glucose, Bld 75 65 - 99 mg/dL   BUN 19 6 - 20 mg/dL   Creatinine, Ser 0.98 0.61 - 1.24 mg/dL   Calcium 9.7 8.9 - 10.3 mg/dL   Total Protein 7.8 6.5 - 8.1 g/dL   Albumin 5.1 (H) 3.5 - 5.0 g/dL   AST 20 15 - 41 U/L   ALT 17 17 - 63 U/L   Alkaline Phosphatase 79 38 - 126 U/L   Total Bilirubin 0.5 0.3 - 1.2 mg/dL   GFR calc non Af Amer >60 >60 mL/min   GFR calc Af Amer >60 >60 mL/min    Comment: (NOTE) The eGFR has been calculated using the CKD EPI  equation. This calculation has not been validated in all clinical situations. eGFR's persistently <60 mL/min signify possible Chronic Kidney Disease.    Anion gap 9 5 - 15  Ethanol     Status: None   Collection Time: 07/29/15 10:22 AM  Result Value Ref Range   Alcohol, Ethyl (B) <5 <5 mg/dL    Comment:        LOWEST DETECTABLE LIMIT FOR SERUM ALCOHOL IS 5 mg/dL FOR MEDICAL  PURPOSES ONLY   Salicylate level     Status: None   Collection Time: 07/29/15 10:22 AM  Result Value Ref Range   Salicylate Lvl <7.6 2.8 - 30.0 mg/dL  Acetaminophen level     Status: Abnormal   Collection Time: 07/29/15 10:22 AM  Result Value Ref Range   Acetaminophen (Tylenol), Serum <10 (L) 10 - 30 ug/mL    Comment:        THERAPEUTIC CONCENTRATIONS VARY SIGNIFICANTLY. A RANGE OF 10-30 ug/mL MAY BE AN EFFECTIVE CONCENTRATION FOR MANY PATIENTS. HOWEVER, SOME ARE BEST TREATED AT CONCENTRATIONS OUTSIDE THIS RANGE. ACETAMINOPHEN CONCENTRATIONS >150 ug/mL AT 4 HOURS AFTER INGESTION AND >50 ug/mL AT 12 HOURS AFTER INGESTION ARE OFTEN ASSOCIATED WITH TOXIC REACTIONS.   cbc     Status: None   Collection Time: 07/29/15 10:22 AM  Result Value Ref Range   WBC 8.7 4.0 - 10.5 K/uL   RBC 5.40 4.22 - 5.81 MIL/uL   Hemoglobin 15.1 13.0 - 17.0 g/dL   HCT 42.7 39.0 - 52.0 %   MCV 79.1 78.0 - 100.0 fL   MCH 28.0 26.0 - 34.0 pg   MCHC 35.4 30.0 - 36.0 g/dL   RDW 13.4 11.5 - 15.5 %   Platelets 202 150 - 400 K/uL    Blood Alcohol level:  Lab Results  Component Value Date   ETH <5 07/29/2015   ETH <5 28/31/5176    Metabolic Disorder Labs:  No results found for: HGBA1C, MPG No results found for: PROLACTIN No results found for: CHOL, TRIG, HDL, CHOLHDL, VLDL, LDLCALC  Current Medications: Current Facility-Administered Medications  Medication Dose Route Frequency Provider Last Rate Last Dose  . acetaminophen (TYLENOL) tablet 650 mg  650 mg Oral Q6H PRN Patrecia Pour, NP   650 mg at 07/30/15 1612  . alum  & mag hydroxide-simeth (MAALOX/MYLANTA) 200-200-20 MG/5ML suspension 30 mL  30 mL Oral Q4H PRN Patrecia Pour, NP      . cloNIDine (CATAPRES) tablet 0.1 mg  0.1 mg Oral Q8H PRN Niel Hummer, NP      . gabapentin (NEURONTIN) capsule 300 mg  300 mg Oral QID Patrecia Pour, NP   300 mg at 07/30/15 1612  . hydrOXYzine (ATARAX/VISTARIL) tablet 50 mg  50 mg Oral TID PRN Patrecia Pour, NP   50 mg at 07/30/15 1612  . magnesium hydroxide (MILK OF MAGNESIA) suspension 30 mL  30 mL Oral Daily PRN Patrecia Pour, NP      . methocarbamol (ROBAXIN) tablet 500 mg  500 mg Oral Q8H PRN Niel Hummer, NP      . traZODone (DESYREL) tablet 100 mg  100 mg Oral QHS Patrecia Pour, NP       PTA Medications: Prescriptions prior to admission  Medication Sig Dispense Refill Last Dose  . gabapentin (NEURONTIN) 300 MG capsule Take 300 mg by mouth 3 (three) times daily.   Past Week at Unknown time  . hydrOXYzine (ATARAX/VISTARIL) 25 MG tablet Take 1 tablet (25 mg total) by mouth every 6 (six) hours as needed for anxiety. (Patient not taking: Reported on 07/29/2015) 30 tablet 0 Not Taking at Unknown time  . hydrOXYzine (VISTARIL) 50 MG capsule Take 50 mg by mouth 2 (two) times daily.   Past Week at Unknown time  . nicotine (NICODERM CQ - DOSED IN MG/24 HOURS) 21 mg/24hr patch Place 1 patch (21 mg total) onto the skin daily. (Patient not taking: Reported on 07/29/2015) 28 patch 0  Not Taking at Unknown time  . traZODone (DESYREL) 50 MG tablet Take 1 tablet (50 mg total) by mouth at bedtime as needed for sleep. (Patient taking differently: Take 50 mg by mouth at bedtime. ) 14 tablet 0 Past Week at Unknown time    Musculoskeletal: Strength & Muscle Tone: within normal limits Gait & Station: normal Patient leans: N/A  Psychiatric Specialty Exam: Physical Exam  Review of Systems  Musculoskeletal: Positive for myalgias.  Psychiatric/Behavioral: Positive for depression, suicidal ideas and substance abuse. Negative for  hallucinations and memory loss. The patient is nervous/anxious. The patient does not have insomnia.     Blood pressure 142/75, pulse 103, resp. rate 16, height '5\' 11"'$  (1.803 m), weight 68.04 kg (150 lb).Body mass index is 20.93 kg/(m^2).  General Appearance: Casual  Eye Contact:  Good  Speech:  Clear and Coherent  Volume:  Normal  Mood:  Depressed  Affect:  Constricted  Thought Process:  Coherent  Orientation:  Full (Time, Place, and Person)  Thought Content:  Symptoms, worries, concerns  Suicidal Thoughts:  Yes.  with intent/plan  Homicidal Thoughts:  No  Memory:  Immediate;   Good Recent;   Good Remote;   Good  Judgement:  Poor  Insight:  Shallow  Psychomotor Activity:  Decreased  Concentration:  Concentration: Good and Attention Span: Good  Recall:  Good  Fund of Knowledge:  Good  Language:  Good  Akathisia:  No  Handed:  Right  AIMS (if indicated):     Assets:  Communication Skills Desire for Improvement Physical Health Resilience  ADL's:  Intact  Cognition:  WNL  Sleep:         Treatment Plan Summary: Daily contact with patient to assess and evaluate symptoms and progress in treatment and Medication management  Observation Level/Precautions:  Continuous Observation Laboratory:  CBC Chemistry Profile UDS Psychotherapy:  Individual for substance abuse counseling  Medications:  Restart Neurontin 300 mg QID for anxiety, vistaril 50 mg TID prn for anxiety, Trazodone 100 mg hs prn insomnia, clonidine prn opiate withdrawal symptoms  Consultations:  As needed Discharge Concerns: Continued substance abuse  Estimated LOS: 24-48 hours Other:  Referrals to be sent to residential treatment facility such as [facility] per Observation Unit staff     Elmarie Shiley, NP 6/23/20175:03 PM

## 2015-07-30 NOTE — Progress Notes (Signed)
Referral sent to [facility] 

## 2015-07-30 NOTE — BH Assessment (Signed)
[facility] Assessment Progress Note  Per Thedore MinsMojeed Akintayo, MD, [facility] pt [facility].  Berneice Heinrichina Tate, RN, [facility] Obs [facility].  Pt has signed Voluntary Admission and Consent for Treatment, as well as Consent to Release Information to no one, and signed [facility].  Pt's nurse, Kendal Hymendie, has been notified, and agrees to send original paperwork along with pt via [facility], and to call report to [phone] or [phone].  Charles Canninghomas Elisabet Gutzmer, MA Triage Specialist [phone]

## 2015-07-30 NOTE — Consult Note (Signed)
[place] Psychiatry Consult   Reason for Consult:  Substance abuse with suicidal ideations and plan Referring Physician:  EDP Patient Identification: Charles Ortega MRN:  [id number] Principal Diagnosis: Cocaine dependence with cocaine-induced mood disorder [facility]) Diagnosis:   Patient Active Problem List   Diagnosis Date Noted  . Cocaine dependence with cocaine-induced mood disorder [facility]) [F14.24] 07/30/2015    Priority: High  . Substance induced mood disorder ([place]) [F19.94] 03/17/2015  . MDD (major depressive disorder), recurrent severe, without psychosis ([place]) [F33.2] 03/17/2015  . Acute encephalopathy [G93.40]   . SIRS (systemic inflammatory response syndrome) (HCC) [R65.10]   . Shock liver [K75.9]   . Acute renal failure syndrome ([place]) [N17.9]   . Hypokalemia [E87.6]   . Mononeuropathy [G58.9]   . Hyponatremia [E87.1]   . Transaminitis [R74.0]   . Rhabdomyolysis [M62.82] 10/03/2014  . AKI (acute kidney injury) ([place]) [N17.9] 10/03/2014  . Elevated transaminase level [R74.0] 10/03/2014  . Polysubstance abuse [F19.10] 10/03/2014  . Leukocytosis [D72.829] 10/03/2014  . Weakness [R53.1]     Total Time spent with patient: 45 minutes  Subjective:   Charles Ortega is a 30 y.o. male patient admitted with substance dependence and depression to [facility] Obs.  HPI:  30 yo male who presented to the ED after using cocaine and amphetamines with depression and plan to hang himself.  Today, he denies suicidal/homicidal ideations, hallucinations.  Anxiety related to withdrawal.  Pleasant and cooperative.  He was taking Trazodone, Vistaril, and gabapentin from Dr. Maida Sale at [facility] in the past which were helpful. Ryan would like rehab at [facility] with transfer to [place].  Past Psychiatric History: substance abuse  Risk to Self: Suicidal Ideation: Yes-Currently Present Suicidal Intent: Yes-Currently Present Is patient at risk for suicide?: Yes Suicidal Plan?: Yes-Currently Present Specify  Current Suicidal Plan: to hang himself Access to Means: Yes Specify Access to Suicidal Means: access to rope What has been your use of drugs/alcohol within the last 12 months?: cocaine and heroin How many times?: 1 Other Self Harm Risks: Sa Triggers for Past Attempts: None known Intentional Self Injurious Behavior: None Risk to Others: Homicidal Ideation: No Thoughts of Harm to Others: No Current Homicidal Intent: No Current Homicidal Plan: No Access to Homicidal Means: No Identified Victim: NA History of harm to others?: No Assessment of Violence: None Noted Violent Behavior Description: NA Does patient have access to weapons?: No Criminal Charges Pending?: No Does patient have a court date: No Prior Inpatient Therapy: Prior Inpatient Therapy: Yes Prior Therapy Dates: 2017 Prior Therapy Facilty/Provider(s): [facility] Reason for Treatment: SA and depression Prior Outpatient Therapy: Prior Outpatient Therapy: Yes Prior Therapy Dates: 2017 Prior Therapy Facilty/Provider(s): [facility] Reason for Treatment: SA Does patient have an ACCT team?: No Does patient have Intensive In-House Services?  : No Does patient have [facility] services? : No Does patient have P4CC services?: No  Past Medical History:  Past Medical History  Diagnosis Date  . Substance abuse     heroin, cocaine   History reviewed. No pertinent past surgical history. Family History:  Family History  Problem Relation Age of Onset  . Alcohol abuse Mother    Family Psychiatric  History: none Social History:  History  Alcohol Use  . 0.6 oz/week  . 1 Cans of beer per week     History  Drug Use  . 1.00 per week  . Special: Cocaine, Heroin, MDMA (Ecstacy), Fentanyl, Marijuana    Comment: heroin- Pt sts "anything and everything"    Social History  Social History  . Marital Status: Single    Spouse Name: N/A  . Number of Children: N/A  . Years of Education: N/A   Social History Main Topics  . Smoking status:  Current Every Day Smoker    Types: Cigarettes  . Smokeless tobacco: None  . Alcohol Use: 0.6 oz/week    1 Cans of beer per week  . Drug Use: 1.00 per week    Special: Cocaine, Heroin, MDMA (Ecstacy), Fentanyl, Marijuana     Comment: heroin- Pt sts "anything and everything"  . Sexual Activity: No     Comment: heroin   Other Topics Concern  . None   Social History Narrative   Additional Social History:    Allergies:  No Known Allergies  Labs:  Results for orders placed or performed during the hospital encounter of 07/29/15 (from the past 48 hour(s))  Rapid urine drug screen (hospital performed)     Status: Abnormal   Collection Time: 07/29/15 10:12 AM  Result Value Ref Range   Opiates NONE DETECTED NONE DETECTED   Cocaine POSITIVE (A) NONE DETECTED   Benzodiazepines NONE DETECTED NONE DETECTED   Amphetamines POSITIVE (A) NONE DETECTED   Tetrahydrocannabinol NONE DETECTED NONE DETECTED   Barbiturates NONE DETECTED NONE DETECTED    Comment:        DRUG SCREEN FOR MEDICAL PURPOSES ONLY.  IF CONFIRMATION IS NEEDED FOR ANY PURPOSE, NOTIFY LAB WITHIN 5 DAYS.        LOWEST DETECTABLE LIMITS FOR URINE DRUG SCREEN Drug Class       Cutoff (ng/mL) Amphetamine      1000 Barbiturate      200 Benzodiazepine   831 Tricyclics       517 Opiates          300 Cocaine          300 THC              50   Comprehensive metabolic panel     Status: Abnormal   Collection Time: 07/29/15 10:22 AM  Result Value Ref Range   Sodium 141 135 - 145 mmol/L   Potassium 4.2 3.5 - 5.1 mmol/L   Chloride 109 101 - 111 mmol/L   CO2 23 22 - 32 mmol/L   Glucose, Bld 75 65 - 99 mg/dL   BUN 19 6 - 20 mg/dL   Creatinine, Ser 0.98 0.61 - 1.24 mg/dL   Calcium 9.7 8.9 - 10.3 mg/dL   Total Protein 7.8 6.5 - 8.1 g/dL   Albumin 5.1 (H) 3.5 - 5.0 g/dL   AST 20 15 - 41 U/L   ALT 17 17 - 63 U/L   Alkaline Phosphatase 79 38 - 126 U/L   Total Bilirubin 0.5 0.3 - 1.2 mg/dL   GFR calc non Af Amer >60 >60 mL/min    GFR calc Af Amer >60 >60 mL/min    Comment: (NOTE) The eGFR has been calculated using the CKD EPI equation. This calculation has not been validated in all clinical situations. eGFR's persistently <60 mL/min signify possible Chronic Kidney Disease.    Anion gap 9 5 - 15  Ethanol     Status: None   Collection Time: 07/29/15 10:22 AM  Result Value Ref Range   Alcohol, Ethyl (B) <5 <5 mg/dL    Comment:        LOWEST DETECTABLE LIMIT FOR SERUM ALCOHOL IS 5 mg/dL FOR MEDICAL PURPOSES ONLY   Salicylate level     Status: None  Collection Time: 07/29/15 10:22 AM  Result Value Ref Range   Salicylate Lvl <6.3 2.8 - 30.0 mg/dL  Acetaminophen level     Status: Abnormal   Collection Time: 07/29/15 10:22 AM  Result Value Ref Range   Acetaminophen (Tylenol), Serum <10 (L) 10 - 30 ug/mL    Comment:        THERAPEUTIC CONCENTRATIONS VARY SIGNIFICANTLY. A RANGE OF 10-30 ug/mL MAY BE AN EFFECTIVE CONCENTRATION FOR MANY PATIENTS. HOWEVER, SOME ARE BEST TREATED AT CONCENTRATIONS OUTSIDE THIS RANGE. ACETAMINOPHEN CONCENTRATIONS >150 ug/mL AT 4 HOURS AFTER INGESTION AND >50 ug/mL AT 12 HOURS AFTER INGESTION ARE OFTEN ASSOCIATED WITH TOXIC REACTIONS.   cbc     Status: None   Collection Time: 07/29/15 10:22 AM  Result Value Ref Range   WBC 8.7 4.0 - 10.5 K/uL   RBC 5.40 4.22 - 5.81 MIL/uL   Hemoglobin 15.1 13.0 - 17.0 g/dL   HCT 42.7 39.0 - 52.0 %   MCV 79.1 78.0 - 100.0 fL   MCH 28.0 26.0 - 34.0 pg   MCHC 35.4 30.0 - 36.0 g/dL   RDW 13.4 11.5 - 15.5 %   Platelets 202 150 - 400 K/uL    Current Facility-Administered Medications  Medication Dose Route Frequency Provider Last Rate Last Dose  . traZODone (DESYREL) tablet 50 mg  50 mg Oral QHS PRN Merian Capron, MD   50 mg at 07/29/15 2108   Current Outpatient Prescriptions  Medication Sig Dispense Refill  . gabapentin (NEURONTIN) 300 MG capsule Take 300 mg by mouth 3 (three) times daily.    . hydrOXYzine (VISTARIL) 50 MG capsule  Take 50 mg by mouth 2 (two) times daily.    . traZODone (DESYREL) 50 MG tablet Take 1 tablet (50 mg total) by mouth at bedtime as needed for sleep. (Patient taking differently: Take 50 mg by mouth at bedtime. ) 14 tablet 0  . hydrOXYzine (ATARAX/VISTARIL) 25 MG tablet Take 1 tablet (25 mg total) by mouth every 6 (six) hours as needed for anxiety. (Patient not taking: Reported on 07/29/2015) 30 tablet 0  . nicotine (NICODERM CQ - DOSED IN MG/24 HOURS) 21 mg/24hr patch Place 1 patch (21 mg total) onto the skin daily. (Patient not taking: Reported on 07/29/2015) 28 patch 0    Musculoskeletal: Strength & Muscle Tone: within normal limits Gait & Station: normal Patient leans: N/A  Psychiatric Specialty Exam: Physical Exam  Constitutional: He is oriented to person, place, and time. He appears well-developed and well-nourished.  HENT:  Head: Normocephalic.  Neck: Normal range of motion.  Respiratory: Effort normal.  Musculoskeletal: Normal range of motion.  Neurological: He is alert and oriented to person, place, and time.  Skin: Skin is warm and dry.  Psychiatric: His speech is normal and behavior is normal. Thought content normal. His mood appears anxious. Cognition and memory are normal. He expresses impulsivity. He exhibits a depressed mood.    Review of Systems  Constitutional: Negative.   HENT: Negative.   Eyes: Negative.   Respiratory: Negative.   Cardiovascular: Negative.   Gastrointestinal: Negative.   Genitourinary: Negative.   Musculoskeletal: Negative.   Skin: Negative.   Neurological: Negative.   Endo/Heme/Allergies: Negative.   Psychiatric/Behavioral: Positive for depression and substance abuse. The patient is nervous/anxious.     Blood pressure 118/73, pulse 78, temperature 97.7 F (36.5 C), temperature source Oral, resp. rate 18, SpO2 100 %.There is no weight on file to calculate BMI.  General Appearance: Disheveled  Eye Contact:  Fair  Speech:  Normal Rate  Volume:   Normal  Mood:  Anxious and Depressed  Affect:  Congruent  Thought Process:  Coherent and Descriptions of Associations: Intact  Orientation:  Full (Time, Place, and Person)  Thought Content:  WDL  Suicidal Thoughts:  No  Homicidal Thoughts:  No  Memory:  Immediate;   Fair Recent;   Fair Remote;   Fair  Judgement:  Fair  Insight:  Fair  Psychomotor Activity:  Decreased  Concentration:  Concentration: Fair and Attention Span: Fair  Recall:  [employer] of Knowledge:  Fair  Language:  Fair  Akathisia:  No  Handed:  Right  AIMS (if indicated):     Assets:  Leisure Time Physical Health Resilience  ADL's:  Intact  Cognition:  WNL  Sleep:        Treatment Plan Summary: Daily contact with patient to assess and evaluate symptoms and progress in treatment, Medication management and Plan cocaine dependence with cocaine induced mood disorder:  -Crisis stabilization -Medication management:  Start gabapentin 300 mg QID for withdrawal symptoms, Trazodone 100 mg at bedtime for sleep, and Vistaril 50 mg TID PRN anxiety. -Individual and substance abuse counseling  Disposition: Supportive therapy provided about ongoing stressors.  Waylan Boga, NP 07/30/2015 11:25 AM Patient seen face-to-face for psychiatric evaluation, chart reviewed and case discussed with the physician extender and developed treatment plan. Reviewed the information documented and agree with the treatment plan. Corena Pilgrim, MD

## 2015-07-30 NOTE — ED Notes (Signed)
Pt discharged ambulatory with [place] driver.  All belongings were sent with pt. 

## 2015-07-30 NOTE — Progress Notes (Signed)
Patient reports SI and contracts for safety; patient reports use of heroin and cocaine and that he uses about 30 dollars worth of each daily; patient also [facility]

## 2015-07-30 NOTE — Progress Notes (Signed)
D:Patient has been resting tonight.  Patient states he is having some mild withdrawal symptoms.  Patient has been calm and cooperative tonight.  Patient states he is passive SI but verbally contracts for safety.  Patient denies HI and denies AVH.  Patient verbally contracts for safety. A: Staff to monitor Q 15 mins for safety.  Encouragement and support offered.  Scheduled medications administered per orders.  Clonidine administered prn for opiate withdrawal. R: Patient remains safe on the unit. Patient taking administered medications.

## 2015-07-31 MED ORDER — HYDROXYZINE HCL 50 MG PO TABS: 50.0000 mg | ORAL_TABLET | Freq: Three times a day (TID) | ORAL | Status: AC | PRN

## 2015-07-31 MED ORDER — GABAPENTIN 300 MG PO CAPS: 300.0000 mg | ORAL_CAPSULE | Freq: Three times a day (TID) | ORAL | Status: AC

## 2015-07-31 MED ORDER — TRAZODONE HCL 100 MG PO TABS: 100.0000 mg | ORAL_TABLET | Freq: Every day | ORAL | Status: AC

## 2015-07-31 NOTE — BHH Suicide Risk Assessment (Signed)
Suicide Risk Assessment  Discharge Assessment   [facility]   Principal Problem: Cocaine dependence with cocaine-induced mood disorder [facility](HCC) Discharge Diagnoses:  Patient Active Problem List   Diagnosis Date Noted  . Cocaine dependence with cocaine-induced mood disorder (HCC) [F14.24] 07/30/2015  . Substance induced mood disorder (HCC) [F19.94] 03/17/2015  . MDD (major depressive disorder), recurrent severe, without psychosis (HCC) [F33.2] 03/17/2015  . Acute encephalopathy [G93.40]   . SIRS (systemic inflammatory response syndrome) (HCC) [R65.10]   . Shock liver [K75.9]   . Acute renal failure syndrome (HCC) [N17.9]   . Hypokalemia [E87.6]   . Mononeuropathy [G58.9]   . Hyponatremia [E87.1]   . Transaminitis [R74.0]   . Rhabdomyolysis [M62.82] 10/03/2014  . AKI (acute kidney injury) (HCC) [N17.9] 10/03/2014  . Elevated transaminase level [R74.0] 10/03/2014  . Polysubstance abuse [F19.10] 10/03/2014  . Leukocytosis [D72.829] 10/03/2014  . Weakness [R53.1]     Total Time spent with patient: 45 minutes   Musculoskeletal: Strength & Muscle Tone: within normal limits Gait & Station: normal Patient leans: N/A  Psychiatric Specialty Exam: Physical Exam  Constitutional: He is oriented to person, place, and time. He appears well-developed and well-nourished.  HENT:  Head: Normocephalic.  Neck: Normal range of motion.  Respiratory: Effort normal.  Musculoskeletal: Normal range of motion.  Neurological: He is alert and oriented to person, place, and time.  Skin: Skin is warm and dry.  Psychiatric: His speech is normal and behavior is normal. Judgment and thought content normal. His mood appears anxious. His affect is blunt. Cognition and memory are normal.    Review of Systems  Constitutional: Negative.   HENT: Negative.   Eyes: Negative.   Respiratory: Negative.   Cardiovascular: Negative.   Gastrointestinal: Negative.   Genitourinary: Negative.    Musculoskeletal: Negative.   Skin: Negative.   Neurological: Negative.   Endo/Heme/Allergies: Negative.   Psychiatric/Behavioral: Positive for substance abuse. The patient is nervous/anxious.     Blood pressure 109/73, pulse 67, temperature 98.1 F (36.7 C), temperature source Oral, resp. rate 16, height 5\' 11"  (1.803 m), weight 68.04 kg (150 lb), SpO2 100 %.Body mass index is 20.93 kg/(m^2).  General Appearance: Casual  Eye Contact:  Good  Speech:  Normal Rate  Volume:  Normal  Mood:  Anxious  Affect:  Congruent  Thought Process:  Coherent and Descriptions of Associations: Intact  Orientation:  Full (Time, Place, and Person)  Thought Content:  WDL  Suicidal Thoughts:  No  Homicidal Thoughts:  No  Memory:  Immediate;   Good Recent;   Good Remote;   Good  Judgement:  Fair  Insight:  Fair  Psychomotor Activity:  Normal  Concentration:  Concentration: Good and Attention Span: Good  Recall:  Good  Fund of Knowledge:  Fair  Language:  Good  Akathisia:  No  Handed:  Right  AIMS (if indicated):     Assets:  Leisure Time Physical Health Resilience  ADL's:  Intact  Cognition:  WNL  Sleep:      Musculoskeletal: Mental Status Per Nursing Assessment::   On Admission:  Suicidal ideation indicated by patient, Suicide plan, Self-harm thoughts  Demographic Factors:  Male and Caucasian  Loss Factors: NA  Historical Factors: Family history of mental illness or substance abuse  Risk Reduction Factors:   Sense of responsibility to family, Positive social support and Positive coping skills or problem solving skills  Continued Clinical Symptoms:  Anxiety, mild  Cognitive Features That Contribute To Risk:  None  Suicide Risk:  Minimal: No identifiable suicidal ideation.  Patients presenting with no risk factors but with morbid ruminations; may be classified as minimal risk based on the severity of the depressive symptoms    Plan Of Care/Follow-up recommendations:   Activity:  as tolerated Diet:  heart healthy diet  LORD, JAMISON, NP 07/31/2015, 11:10 AM

## 2015-07-31 NOTE — BHH Counselor (Signed)
Per Jamison Y Lord, NP patient will be discharged this afternoon with OPT resources. Pt will be given a bus pass upon discharge.   Doreatha Offer McNeil, MA OBS Counselor 

## 2015-07-31 NOTE — Discharge Summary (Signed)
Physician Discharge Summary Note  Patient:  Charles Ortega is an 30 y.o., male MRN:  [id number] DOB:  1985/05/12 Patient phone:  [phone] (home)  Patient address:   [place]  [address],  Total Time spent with patient: 45 minutes  Date of Admission:  07/30/2015 Date of Discharge: 07/31/2015  Reason for Admission:   Substance abuse with suicidal ideations  Principal Problem: Cocaine dependence with cocaine-induced mood disorder [facility]) Discharge Diagnoses: Patient Active Problem List   Diagnosis Date Noted  . Cocaine dependence with cocaine-induced mood disorder ([place]) [F14.24] 07/30/2015  . Substance induced mood disorder ([place]) [F19.94] 03/17/2015  . MDD (major depressive disorder), recurrent severe, without psychosis ([place]) [F33.2] 03/17/2015  . Acute encephalopathy [G93.40]   . SIRS (systemic inflammatory response syndrome) (HCC) [R65.10]   . Shock liver [K75.9]   . Acute renal failure syndrome ([place]) [N17.9]   . Hypokalemia [E87.6]   . Mononeuropathy [G58.9]   . Hyponatremia [E87.1]   . Transaminitis [R74.0]   . Rhabdomyolysis [M62.82] 10/03/2014  . AKI (acute kidney injury) ([place]) [N17.9] 10/03/2014  . Elevated transaminase level [R74.0] 10/03/2014  . Polysubstance abuse [F19.10] 10/03/2014  . Leukocytosis [D72.829] 10/03/2014  . Weakness [R53.1]     Past Psychiatric History: substance abuse  Past Medical History:  Past Medical History  Diagnosis Date  . Substance abuse     heroin, cocaine   History reviewed. No pertinent past surgical history. Family History:  Family History  Problem Relation Age of Onset  . Alcohol abuse Mother    Family Psychiatric  History: mother alcohol abuse Social History:  History  Alcohol Use  . 0.6 oz/week  . 1 Cans of beer per week     History  Drug Use  . 1.00 per week  . Special: Cocaine, Heroin, MDMA (Ecstacy), Fentanyl, Marijuana    Comment: heroin- Pt sts "anything and everything"    Social History    Social History  . Marital Status: Single    Spouse Name: N/A  . Number of Children: N/A  . Years of Education: N/A   Social History Main Topics  . Smoking status: Current Every Day Smoker    Types: Cigarettes  . Smokeless tobacco: None  . Alcohol Use: 0.6 oz/week    1 Cans of beer per week  . Drug Use: 1.00 per week    Special: Cocaine, Heroin, MDMA (Ecstacy), Fentanyl, Marijuana     Comment: heroin- Pt sts "anything and everything"  . Sexual Activity: No     Comment: heroin   Other Topics Concern  . None   Social History Narrative    Hospital Course:  On admission:  27 male who presented to [facility] reporting a plan to hang himself. Patient uses heroin, crack cocaine, and powder cocaine. The patient reports daily use in varying amounts depending on the availability. Patient has received inpatient treatment at [facility] and outpatient services at [facility]. Patient states he is prescribed Visitaril, Gabbapentin, and Trazodone from his psychiatrist at [facility]. Patient denies any past abuse. Patient states "I feel very depressed. I have been staying at a hotel. I tried to hang myself there. I have been abusing heroin/cocaine for the past two years. I have a drug dealer who is after me. I owe them money. I feel scared. I want a treatment center then a program like [facility]. I am still thinking of ending my life. I do not want to hurt anyone else and I do not hear voices. I think the opiates make the  depression worse. I have not used any in three days but still feel some mild body aches, cramps, and my nose has been runny." On admission his urine drug screen is positive for cocaine and amphetamines. Patient appears depressed throughout the assessment. He is unable to contract for safety outside the hospital.   Today, patient has met maximum benefit of hospitalization and deemed stable for discharge.  He denies suicidal/homicidal ideations, hallucinations, and withdrawal symptoms.  He was started on Clonidine 0.1  mg every 8 hours for withdrawal symptoms along with Robaxin 500 mg every 8 hours for muscle cramps.  His Trazodone 50 mg at bedtime was increased to 100 mg for sleep and his Vistaril 50 mg BID was changed to TID PRN anxiety.  Gabapentin 300 mg TID for withdrawal symptoms continued.  He has follow-up information for [facility] who will have bed availability on Monday.  Stable for discharge.  Physical Findings: AIMS:  , ,  ,  ,    CIWA:    COWS:  COWS Total Score: 5  Musculoskeletal: Strength & Muscle Tone: within normal limits Gait & Station: normal Patient leans: N/A  Psychiatric Specialty Exam: Physical Exam  Constitutional: He is oriented to person, place, and time. He appears well-developed and well-nourished.  HENT:  Head: Normocephalic.  Neck: Normal range of motion.  Respiratory: Effort normal.  Musculoskeletal: Normal range of motion.  Neurological: He is alert and oriented to person, place, and time.  Skin: Skin is warm and dry.  Psychiatric: His speech is normal and behavior is normal. Judgment and thought content normal. His mood appears anxious. His affect is blunt. Cognition and memory are normal.    Review of Systems  Constitutional: Negative.   HENT: Negative.   Eyes: Negative.   Respiratory: Negative.   Cardiovascular: Negative.   Gastrointestinal: Negative.   Genitourinary: Negative.   Musculoskeletal: Negative.   Skin: Negative.   Neurological: Negative.   Endo/Heme/Allergies: Negative.   Psychiatric/Behavioral: Positive for substance abuse. The patient is nervous/anxious.     Blood pressure 109/73, pulse 67, temperature 98.1 F (36.7 C), temperature source Oral, resp. rate 16, height '5\' 11"'$  (1.803 m), weight 68.04 kg (150 lb), SpO2 100 %.Body mass index is 20.93 kg/(m^2).  General Appearance: Casual  Eye Contact:  Good  Speech:  Normal Rate  Volume:  Normal  Mood:  Anxious  Affect:  Congruent  Thought Process:  Coherent and Descriptions of Associations:  Intact  Orientation:  Full (Time, Place, and Person)  Thought Content:  WDL  Suicidal Thoughts:  No  Homicidal Thoughts:  No  Memory:  Immediate;   Good Recent;   Good Remote;   Good  Judgement:  Fair  Insight:  Fair  Psychomotor Activity:  Normal  Concentration:  Concentration: Good and Attention Span: Good  Recall:  Good  Fund of Knowledge:  Fair  Language:  Good  Akathisia:  No  Handed:  Right  AIMS (if indicated):     Assets:  Leisure Time Physical Health Resilience  ADL's:  Intact  Cognition:  WNL  Sleep:        Have you used any form of tobacco in the last 30 days? (Cigarettes, Smokeless Tobacco, Cigars, and/or Pipes): Yes  Has this patient used any form of tobacco in the last 30 days? (Cigarettes, Smokeless Tobacco, Cigars, and/or Pipes) Yes, Yes, A prescription for an FDA-approved tobacco cessation medication was offered at discharge and the patient refused  Blood Alcohol level:  Lab Results  Component Value  Date   ETH <5 07/29/2015   ETH <5 30/74/6002    Metabolic Disorder Labs:  No results found for: HGBA1C, MPG No results found for: PROLACTIN No results found for: CHOL, TRIG, HDL, CHOLHDL, VLDL, LDLCALC  See Psychiatric Specialty Exam and Suicide Risk Assessment completed by Attending Physician prior to discharge.  Discharge destination:  Home  Is patient on multiple antipsychotic therapies at discharge:  No   Has Patient had three or more failed trials of antipsychotic monotherapy by history:  No  Recommended Plan for Multiple Antipsychotic Therapies: NA     Medication List    ASK your doctor about these medications      Indication   gabapentin 300 MG capsule  Commonly known as:  NEURONTIN  Take 300 mg by mouth 3 (three) times daily.      hydrOXYzine 25 MG tablet  Commonly known as:  ATARAX/VISTARIL  Take 1 tablet (25 mg total) by mouth every 6 (six) hours as needed for anxiety.   Indication:  Anxiety Neurosis     hydrOXYzine 50 MG  capsule  Commonly known as:  VISTARIL  Take 50 mg by mouth 2 (two) times daily.      nicotine 21 mg/24hr patch  Commonly known as:  NICODERM CQ - dosed in mg/24 hours  Place 1 patch (21 mg total) onto the skin daily.   Indication:  Nicotine Addiction     traZODone 50 MG tablet  Commonly known as:  DESYREL  Take 1 tablet (50 mg total) by mouth at bedtime as needed for sleep.   Indication:  Trouble Sleeping         Follow-up recommendations:  Activity:  as tolerated Diet:  heart healthy diet  Comments:  Referral to [facility] with bed availability on Monday, Rx at discharge with a bus pass  Signed: Waylan Boga, NP 07/31/2015, 11:02 AM   Reviewed the information documented and agree with the treatment plan.  Charles Ortega 08/01/2015 2:27 PM

## 2015-07-31 NOTE — Progress Notes (Addendum)
D: Pt d/c home as per MD's order. Pt received bus passes at time of d/c.  A: Scheduled and PRN medications administered as prescribed. Emotional support and encouragement provided to pt. D/C instructions reviewed with pt including prescription.  All belongings in locker 38 returned to pt at time of departure. Visual observation maintained while in observation unit for safety without self injurious behavior or outburst to report.  R: Pt A & O X 4. Denies SI, HI, AVh and pain when assessed. Receptive to care. Compliant with mediations when offered. Denies adverse drug reactions. Signed belonging sheet in agreement with items received. Vitals stable, no physical distress to note at time of d/c.

## 2015-07-31 NOTE — BHH Counselor (Signed)
Pt referral refaxed to [facility] due to intake Staff (Marcus) stating that it was not received. As of this morning [facility] is currently at capacity, but are expecting discharges on Monday. Counselor will speak with AC Tina in regards to inpatient admission on detox hall for this pt.   Darin Redmann McNeil,MA OBS Counselor 

## 2015-09-07 DEATH — deceased

## 2016-01-23 IMAGING — CR DG CHEST 2V
2 series · 2 of 2 positions shown · non-contrast
Comparison: 06/24/2006

CLINICAL DATA: Abnormal x-ray. Airspace opacities suggested on MRI
thoracic spine. Patient reports no chest pain or complaints. Current
smoker.

EXAM:
CHEST  2 VIEW

[w chest lat]
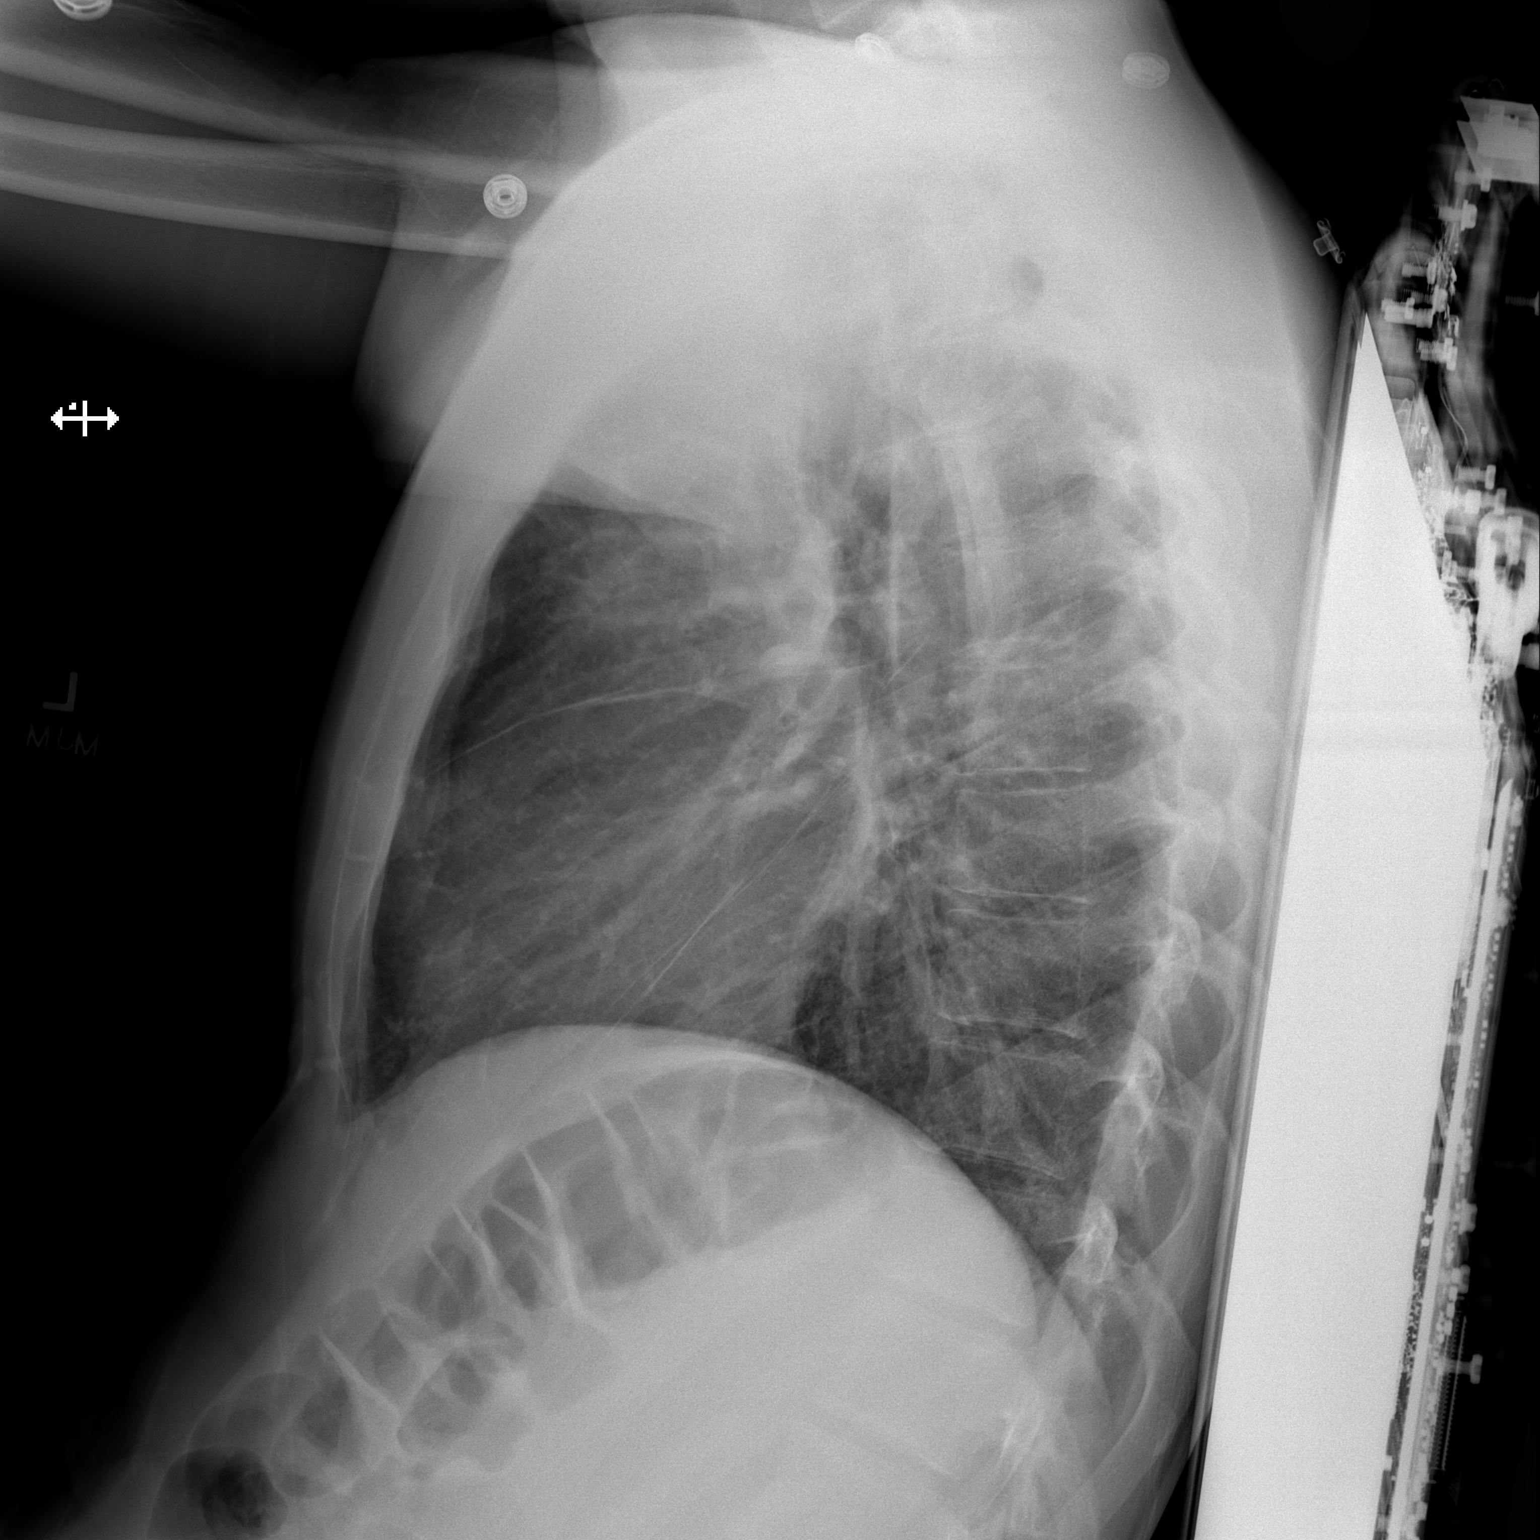

[x chest ap]
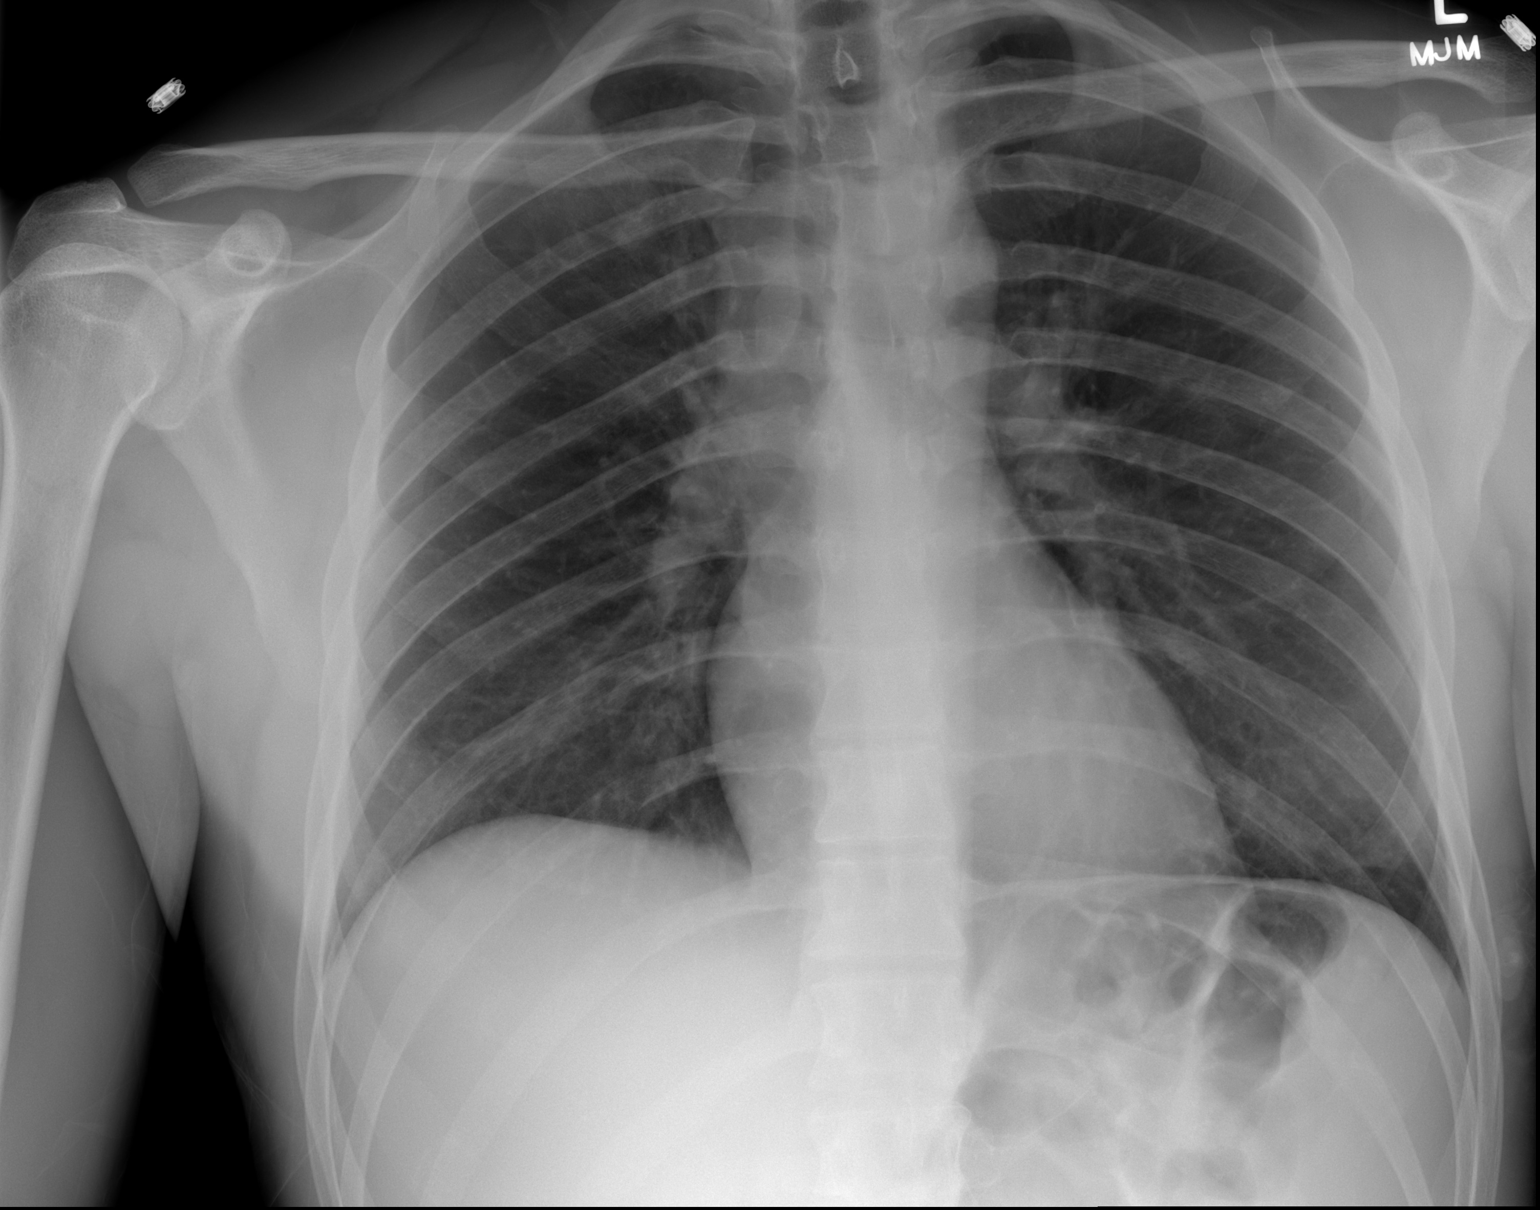

[2 of 2 positions shown; findings below may reference images not displayed]

FINDINGS: The heart size and mediastinal contours are within normal limits.
Both lungs are clear. The visualized skeletal structures are
unremarkable.
IMPRESSION: No active cardiopulmonary disease. MRI findings may have been
related to motion artifact.
